# Patient Record
Sex: Male | Born: 1980 | Race: White | Hispanic: No | Marital: Married | Smoking: Never smoker
Health system: Southern US, Community
[De-identification: ages and names within clinical notes are randomized; demographics above are authoritative.]

## PROBLEM LIST (undated history)

## (undated) DIAGNOSIS — J069 Acute upper respiratory infection, unspecified: Secondary | ICD-10-CM

## (undated) DIAGNOSIS — I82890 Acute embolism and thrombosis of other specified veins: Secondary | ICD-10-CM

## (undated) DIAGNOSIS — M7711 Lateral epicondylitis, right elbow: Secondary | ICD-10-CM

## (undated) DIAGNOSIS — E669 Obesity, unspecified: Secondary | ICD-10-CM

## (undated) DIAGNOSIS — G4730 Sleep apnea, unspecified: Secondary | ICD-10-CM

## (undated) HISTORY — DX: Obesity, unspecified: E66.9

## (undated) HISTORY — DX: Sleep apnea, unspecified: G47.30

## (undated) HISTORY — PX: OTHER SURGICAL HISTORY: SHX169

## (undated) HISTORY — DX: Acute embolism and thrombosis of other specified veins: I82.890

## (undated) HISTORY — DX: Acute upper respiratory infection, unspecified: J06.9

## (undated) HISTORY — DX: Lateral epicondylitis, right elbow: M77.11

---

## 2016-01-13 DIAGNOSIS — E669 Obesity, unspecified: Secondary | ICD-10-CM | POA: Insufficient documentation

## 2016-01-13 DIAGNOSIS — M7711 Lateral epicondylitis, right elbow: Secondary | ICD-10-CM | POA: Insufficient documentation

## 2016-01-14 ENCOUNTER — Ambulatory Visit (INDEPENDENT_AMBULATORY_CARE_PROVIDER_SITE_OTHER): Payer: Managed Care, Other (non HMO) | Admitting: Family Medicine

## 2016-01-14 ENCOUNTER — Encounter: Payer: Self-pay | Admitting: Family Medicine

## 2016-01-14 VITALS — BP 118/76 | HR 59 | Temp 97.8°F | Resp 16 | Ht 71.0 in | Wt 259.0 lb

## 2016-01-14 DIAGNOSIS — I6529 Occlusion and stenosis of unspecified carotid artery: Secondary | ICD-10-CM | POA: Insufficient documentation

## 2016-01-14 DIAGNOSIS — Z8249 Family history of ischemic heart disease and other diseases of the circulatory system: Secondary | ICD-10-CM | POA: Diagnosis not present

## 2016-01-14 DIAGNOSIS — G4719 Other hypersomnia: Secondary | ICD-10-CM | POA: Diagnosis not present

## 2016-01-14 DIAGNOSIS — E669 Obesity, unspecified: Secondary | ICD-10-CM

## 2016-01-14 DIAGNOSIS — I6522 Occlusion and stenosis of left carotid artery: Secondary | ICD-10-CM | POA: Diagnosis not present

## 2016-01-14 DIAGNOSIS — R202 Paresthesia of skin: Secondary | ICD-10-CM | POA: Diagnosis not present

## 2016-01-14 DIAGNOSIS — M7711 Lateral epicondylitis, right elbow: Secondary | ICD-10-CM

## 2016-01-14 NOTE — Progress Notes (Signed)
Subjective:    Patient ID: Adam Schultz, male    DOB: 10/29/1980, 35 y.o.   MRN: [id number]  HPI: Adam Schultz is a 35 y.o. male presenting on 01/14/2016 for spot on neck   HPI  Pt presents because XR at dental office found possible carotid artery lesion. He was told to follow-up with PCP. Dad has history of TIA, HTN, HLD. Dad is also prediabetic. No personal history of of TIA. No numbness of tingling on one side of body. Has occasional hand paresthesias from elbow down while sleeping. No CP, No SOB.  Pt also reports snoring- very loud heard in another room. Wife reports he stops breathing in his sleep. Excessive day time sleepiness. Feels like even if he got 10 hours of sleep- he is not rested. Neck circumference is 17in. STOP BANG Score is: 6/8.  Risk factors include obesity and large neck circumference. Epworth sleepiness scale is 12.  Hand numbness- occurs during sleep. Only on R side. Sleeps with arms above head, under pillow.   Past Medical History  Diagnosis Date  . Right tennis elbow   . Obesity   . URI, acute     Current Outpatient Prescriptions on File Prior to Visit  Medication Sig  . dextromethorphan-guaiFENesin (MUCINEX DM) 30-600 MG 12hr tablet Take 1 tablet by mouth 2 (two) times daily as needed for cough.   No current facility-administered medications on file prior to visit.    Review of Systems  Constitutional: Positive for fatigue. Negative for fever and chills.  HENT: Negative.   Respiratory: Positive for apnea (wife reports he stops breathing during his sleep. ). Negative for chest tightness, shortness of breath and wheezing.        Snoring   Cardiovascular: Negative for chest pain, palpitations and leg swelling.  Gastrointestinal: Negative for nausea, vomiting and abdominal pain.  Endocrine: Negative.   Genitourinary: Negative for dysuria, urgency, discharge, penile pain and testicular pain.  Musculoskeletal: Negative for back pain, joint swelling and  arthralgias.  Skin: Negative.   Neurological: Positive for numbness (hand). Negative for dizziness, weakness and headaches.  Psychiatric/Behavioral: Negative for sleep disturbance and dysphoric mood.   Per HPI unless specifically indicated above     Objective:    BP 118/76 mmHg  Pulse 59  Temp(Src) 97.8 F (36.6 C) (Oral)  Resp 16  Ht 5\' 11"  (1.803 m)  Wt 259 lb (117.482 kg)  BMI 36.14 kg/m2  Wt Readings from Last 3 Encounters:  01/14/16 259 lb (117.482 kg)  01/13/16 260 lb (117.935 kg)    Physical Exam  Constitutional: He is oriented to person, place, and time. He appears well-developed and well-nourished. No distress.  HENT:  Head: Normocephalic and atraumatic.  Mouth/Throat: Uvula is midline, oropharynx is clear and moist and mucous membranes are normal.  Neck: Neck supple. No thyromegaly present.  Cardiovascular: Normal rate, regular rhythm and normal heart sounds.  PMI is not displaced.  Exam reveals no gallop and no friction rub.   No murmur heard. Pulses:      Carotid pulses are 2+ on the right side, and 2+ on the left side.      Radial pulses are 2+ on the right side, and 2+ on the left side.  No carotid bruit  Pulmonary/Chest: Effort normal and breath sounds normal. He has no wheezes.  Abdominal: Soft. Bowel sounds are normal. He exhibits no distension. There is no tenderness. There is no rebound.  Musculoskeletal: Normal range of motion. He exhibits no edema  or tenderness.  Neurological: He is alert and oriented to person, place, and time. He has normal strength and normal reflexes. A sensory deficit (diminished sensation to monofilament on palmar surface of R hand) is present. No cranial nerve deficit. He displays a negative Romberg sign.  Skin: Skin is warm and dry. No rash noted. No erythema.  Psychiatric: He has a normal mood and affect. His behavior is normal. Thought content normal.   No results found for this or any previous visit.    Assessment & Plan:    Problem List Items Addressed This Visit      Cardiovascular and Mediastinum   Carotid artery calcification - Primary    Found on dental XR. No bruit. Check CMET and lipids. [place] to determine if lesion is present. Consider referral to vascular surgery PRN.  F/u pending [place].       Relevant Orders   US Carotid Duplex Bilateral   Lipid Profile     Musculoskeletal and Integument   Right tennis elbow     Other   Obesity    Encouraged weight loss to prevent chronic disease.       Excessive daytime sleepiness     Likely 2/2 sleep apnea. STOP BANG 6/8; Epiworth sleepiness 12; Refer to sleep medicine to set up a sleep study to r/o sleep apnea.       Relevant Orders   Comprehensive Metabolic Panel (CMET)   Ambulatory referral to Sleep Studies    Other Visit Diagnoses    Family history of heart disease        Right hand paresthesia        Check B12.     Relevant Orders    CBC with Differential    B12 and Folate Panel       Meds ordered this encounter  Medications  . loratadine (CLARITIN) 10 MG tablet    Sig: Take 10 mg by mouth daily.      Follow up plan: Return if symptoms worsen or fail to improve, for pending labwork.Marland Kitchen

## 2016-01-14 NOTE — Assessment & Plan Note (Signed)
Encouraged weight loss to prevent chronic disease.

## 2016-01-14 NOTE — Assessment & Plan Note (Signed)
Likely 2/2 sleep apnea. STOP BANG 6/8; Epiworth sleepiness 12; Refer to sleep medicine to set up a sleep study to r/o sleep apnea.

## 2016-01-14 NOTE — Patient Instructions (Signed)
Please get your labwork done so we can stratify your risk factors. We will set up an ultrasound of your neck to check out what your dentist found.    We will also set you up for a sleep study.

## 2016-01-14 NOTE — Assessment & Plan Note (Signed)
Found on dental XR. No bruit. Check CMET and lipids. US to determine if lesion is present. Consider referral to vascular surgery PRN.  F/u pending US.

## 2016-01-19 ENCOUNTER — Telehealth: Payer: Self-pay | Admitting: Family Medicine

## 2016-01-19 LAB — COMPREHENSIVE METABOLIC PANEL
ALK PHOS: 50 IU/L (ref 39–117)
ALT: 28 IU/L (ref 0–44)
AST: 30 IU/L (ref 0–40)
Albumin/Globulin Ratio: 1.7 (ref 1.2–2.2)
Albumin: 4.7 g/dL (ref 3.5–5.5)
BUN/Creatinine Ratio: 12 (ref 9–20)
BUN: 14 mg/dL (ref 6–20)
Bilirubin Total: 0.6 mg/dL (ref 0.0–1.2)
CALCIUM: 9.5 mg/dL (ref 8.7–10.2)
CO2: 20 mmol/L (ref 18–29)
CREATININE: 1.18 mg/dL (ref 0.76–1.27)
Chloride: 99 mmol/L (ref 96–106)
GFR calc Af Amer: 91 mL/min/{1.73_m2} (ref >59)
GFR, EST NON AFRICAN AMERICAN: 79 mL/min/{1.73_m2} (ref >59)
GLOBULIN, TOTAL: 2.8 g/dL (ref 1.5–4.5)
GLUCOSE: 92 mg/dL (ref 65–99)
Potassium: 4 mmol/L (ref 3.5–5.2)
SODIUM: 138 mmol/L (ref 134–144)
Total Protein: 7.5 g/dL (ref 6.0–8.5)

## 2016-01-19 LAB — CBC WITH DIFFERENTIAL/PLATELET
BASOS: 0 %
Basophils Absolute: 0 10*3/uL (ref 0.0–0.2)
EOS (ABSOLUTE): 0.3 10*3/uL (ref 0.0–0.4)
EOS: 3 %
Hematocrit: 43.4 % (ref 37.5–51.0)
Hemoglobin: 15.3 g/dL (ref 12.6–17.7)
IMMATURE GRANS (ABS): 0 10*3/uL (ref 0.0–0.1)
IMMATURE GRANULOCYTES: 0 %
LYMPHS: 34 %
Lymphocytes Absolute: 3.2 10*3/uL — ABNORMAL HIGH (ref 0.7–3.1)
MCH: 30.3 pg (ref 26.6–33.0)
MCHC: 35.3 g/dL (ref 31.5–35.7)
MCV: 86 fL (ref 79–97)
MONOCYTES: 7 %
MONOS ABS: 0.7 10*3/uL (ref 0.1–0.9)
NEUTROS PCT: 56 %
Neutrophils Absolute: 5.2 10*3/uL (ref 1.4–7.0)
Platelets: 186 10*3/uL (ref 150–379)
RBC: 5.05 x10E6/uL (ref 4.14–5.80)
RDW: 14.3 % (ref 12.3–15.4)
WBC: 9.4 10*3/uL (ref 3.4–10.8)

## 2016-01-19 LAB — B12 AND FOLATE PANEL
Folate: 13.1 ng/mL (ref >3.0)
Vitamin B-12: 539 pg/mL (ref 211–946)

## 2016-01-19 LAB — LIPID PANEL
CHOL/HDL RATIO: 5.7 ratio — AB (ref 0.0–5.0)
Cholesterol, Total: 226 mg/dL — ABNORMAL HIGH (ref 100–199)
HDL: 40 mg/dL (ref >39)
LDL CALC: 162 mg/dL — AB (ref 0–99)
TRIGLYCERIDES: 120 mg/dL (ref 0–149)
VLDL Cholesterol Cal: 24 mg/dL (ref 5–40)

## 2016-01-19 NOTE — Telephone Encounter (Signed)
As pt was notified for lab work advised him that once his appointment is schedule we will call and let  him know.

## 2016-01-19 NOTE — Telephone Encounter (Signed)
Pt called to check the status of US and sleep study.  His call back number is [phone]

## 2016-01-25 ENCOUNTER — Ambulatory Visit
Admission: RE | Admit: 2016-01-25 | Discharge: 2016-01-25 | Disposition: A | Discharge status: Home / Self Care | Payer: Managed Care, Other (non HMO) | Source: Ambulatory Visit | Attending: Family Medicine | Admitting: Family Medicine

## 2016-01-25 DIAGNOSIS — I6522 Occlusion and stenosis of left carotid artery: Secondary | ICD-10-CM | POA: Diagnosis not present

## 2016-01-26 ENCOUNTER — Telehealth: Payer: Self-pay | Admitting: Family Medicine

## 2016-01-26 DIAGNOSIS — I6522 Occlusion and stenosis of left carotid artery: Secondary | ICD-10-CM

## 2016-01-26 MED ORDER — ATORVASTATIN CALCIUM 20 MG PO TABS: 20.0000 mg | ORAL_TABLET | Freq: Every day | ORAL | Status: DC

## 2016-01-26 NOTE — Telephone Encounter (Signed)
Called to review carotid ultrasound results. He does have blockage <50%. Given cholesterol we will start a statin to prevent further blockages. Also consider baby aspirin. Discussed if patient would like to see vein and vascular for discussion of risks and surgical treatment if ever needed. Will discuss in depth at 3 mos follow-up.

## 2016-03-17 ENCOUNTER — Telehealth: Payer: Self-pay | Admitting: Family Medicine

## 2016-03-17 DIAGNOSIS — G4719 Other hypersomnia: Secondary | ICD-10-CM

## 2016-03-17 DIAGNOSIS — G4733 Obstructive sleep apnea (adult) (pediatric): Secondary | ICD-10-CM

## 2016-03-17 NOTE — Telephone Encounter (Signed)
Referral has been entered for pulmonary consult. Also called [facility] and left a message  Need to find out if they offer in home sleep study.

## 2016-03-17 NOTE — Telephone Encounter (Signed)
Pt was going to do sleep study at the hospital but his insurance denied it.  Please call [phone]

## 2016-04-10 ENCOUNTER — Institutional Professional Consult (permissible substitution): Payer: Managed Care, Other (non HMO) | Admitting: Internal Medicine

## 2016-04-14 ENCOUNTER — Encounter: Payer: Self-pay | Admitting: Internal Medicine

## 2016-04-14 ENCOUNTER — Ambulatory Visit (INDEPENDENT_AMBULATORY_CARE_PROVIDER_SITE_OTHER): Payer: Managed Care, Other (non HMO) | Admitting: Internal Medicine

## 2016-04-14 VITALS — BP 126/68 | HR 94 | Ht 72.0 in | Wt 248.0 lb

## 2016-04-14 DIAGNOSIS — G4719 Other hypersomnia: Secondary | ICD-10-CM

## 2016-04-14 NOTE — Patient Instructions (Signed)
Obtain Sleep study ASAP  Sleep Apnea  Sleep apnea is a sleep disorder characterized by abnormal pauses in breathing while you sleep. When your breathing pauses, the level of oxygen in your blood decreases. This causes you to move out of deep sleep and into light sleep. As a result, your quality of sleep is poor, and the system that carries your blood throughout your body (cardiovascular system) experiences stress. If sleep apnea remains untreated, the following conditions can develop:  High blood pressure (hypertension).  Coronary artery disease.  Inability to achieve or maintain an erection (impotence).  Impairment of your thought process (cognitive dysfunction). There are three types of sleep apnea: 1. Obstructive sleep apnea--Pauses in breathing during sleep because of a blocked airway. 2. Central sleep apnea--Pauses in breathing during sleep because the area of the brain that controls your breathing does not send the correct signals to the muscles that control breathing. 3. Mixed sleep apnea--A combination of both obstructive and central sleep apnea. RISK FACTORS The following risk factors can increase your risk of developing sleep apnea:  Being overweight.  Smoking.  Having narrow passages in your nose and throat.  Being of older age.  Being male.  Alcohol use.  Sedative and tranquilizer use.  Ethnicity. Among individuals younger than 35 years, African Americans are at increased risk of sleep apnea. SYMPTOMS   Difficulty staying asleep.  Daytime sleepiness and fatigue.  Loss of energy.  Irritability.  Loud, heavy snoring.  Morning headaches.  Trouble concentrating.  Forgetfulness.  Decreased interest in sex.  Unexplained sleepiness. DIAGNOSIS  In order to diagnose sleep apnea, your caregiver will perform a physical examination. A sleep study done in the comfort of your own home may be appropriate if you are otherwise healthy. Your caregiver may also  recommend that you spend the night in a sleep lab. In the sleep lab, several monitors record information about your heart, lungs, and brain while you sleep. Your leg and arm movements and blood oxygen level are also recorded. TREATMENT The following actions may help to resolve mild sleep apnea:  Sleeping on your side.   Using a decongestant if you have nasal congestion.   Avoiding the use of depressants, including alcohol, sedatives, and narcotics.   Losing weight and modifying your diet if you are overweight. There also are devices and treatments to help open your airway:  Oral appliances. These are custom-made mouthpieces that shift your lower jaw forward and slightly open your bite. This opens your airway.  Devices that create positive airway pressure. This positive pressure "splints" your airway open to help you breathe better during sleep. The following devices create positive airway pressure:  Continuous positive airway pressure (CPAP) device. The CPAP device creates a continuous level of air pressure with an air pump. The air is delivered to your airway through a mask while you sleep. This continuous pressure keeps your airway open.  Nasal expiratory positive airway pressure (EPAP) device. The EPAP device creates positive air pressure as you exhale. The device consists of single-use valves, which are inserted into each nostril and held in place by adhesive. The valves create very little resistance when you inhale but create much more resistance when you exhale. That increased resistance creates the positive airway pressure. This positive pressure while you exhale keeps your airway open, making it easier to breath when you inhale again.  Bilevel positive airway pressure (BPAP) device. The BPAP device is used mainly in patients with central sleep apnea. This device is similar  to the CPAP device because it also uses an air pump to deliver continuous air pressure through a mask. However,  with the BPAP machine, the pressure is set at two different levels. The pressure when you exhale is lower than the pressure when you inhale.  Surgery. Typically, surgery is only done if you cannot comply with less invasive treatments or if the less invasive treatments do not improve your condition. Surgery involves removing excess tissue in your airway to create a wider passage way.   This information is not intended to replace advice given to you by your health care provider. Make sure you discuss any questions you have with your health care provider.   Document Released: 08/25/2002 Document Revised: 09/25/2014 Document Reviewed: 01/11/2012 Elsevier Interactive Patient Education [employer].

## 2016-04-14 NOTE — Progress Notes (Signed)
[facility] Pulmonary Medicine Consultation      Date: 04/14/2016,   MRN# [id number] Koi Zangara 08-20-1981 Code Status:  Code Status History    This patient does not have a recorded code status. Please follow your organizational policy for patients in this situation.     Hosp day:@LENGTHOFSTAYDAYS @ Referring MD: @     PCP:      AdmissionWeight: 248 lb (112.5 kg)                 CurrentWeight: 248 lb (112.5 kg) Zong Mcquarrie is a 35 y.o. 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 Nonsmoker, occasional ETOH use Very active, has 2 kids Epworth sleep score 12  No signs of infection at this time, no lower ext swelling noted     PAST MEDICAL HISTORY   Past Medical History:  Diagnosis Date  . Obesity   . Right tennis elbow   . URI, acute      SURGICAL HISTORY   Past Surgical History:  Procedure Laterality Date  . none       FAMILY HISTORY   Family History  Problem Relation Age of Onset  . Hyperlipidemia Father   . Hypertension Father   . Transient ischemic attack Father   . Stroke Father      SOCIAL HISTORY   Social History  Substance Use Topics  . Smoking status: Never Smoker  . Smokeless tobacco: Never Used  . Alcohol use 3.6 oz/week    6 Cans of beer per week     MEDICATIONS    Home Medication:  Current Outpatient Rx  . Order #: [id number] Class: Historical Med  . Order #: [id number] Class: Historical Med    Current Medication:  Current Outpatient Prescriptions:  .   dextromethorphan-guaiFENesin (MUCINEX DM) 30-600 MG 12hr tablet, Take 1 tablet by mouth 2 (two) times daily as needed for cough., Disp: , Rfl:  .  loratadine (CLARITIN) 10 MG tablet, Take 10 mg by mouth daily., Disp: , Rfl:     ALLERGIES   Review of patient's allergies indicates no known allergies.     REVIEW OF SYSTEMS   Review of Systems  Constitutional: Negative for chills, diaphoresis, fever and weight loss.  HENT: Negative for congestion and hearing loss.   Eyes: Negative for blurred vision and double vision.  Respiratory: Negative for cough, hemoptysis, sputum production, shortness of breath and wheezing.   Cardiovascular: Negative for chest pain, palpitations and orthopnea.  Gastrointestinal: Negative for abdominal pain, heartburn, nausea and vomiting.  Genitourinary: Negative for dysuria and urgency.  Musculoskeletal: Negative for back pain, myalgias and neck pain.  Skin: Negative for rash.  Neurological: Negative for dizziness, weakness and headaches.  Endo/Heme/Allergies: Does not bruise/bleed easily.  Psychiatric/Behavioral: Negative for depression.  All other systems reviewed and are negative.    VS: BP 126/68 (BP Location: Left Arm, Cuff Size: Normal)   Pulse 94   Ht 6' (1.829 m)   Wt 248 lb (112.5  kg)   SpO2 100%   BMI 33.63 kg/m      PHYSICAL EXAM  Physical Exam  Constitutional: He is oriented to person, place, and time. He appears well-developed and well-nourished. No distress.  HENT:  Head: Normocephalic and atraumatic.  Mouth/Throat: No oropharyngeal exudate.  Eyes: EOM are normal. Pupils are equal, round, and reactive to light. No scleral icterus.  Neck: Normal range of motion. Neck supple.  Cardiovascular: Normal rate, regular rhythm and normal heart sounds.   No murmur heard. Pulmonary/Chest: No stridor. No respiratory distress. He has no wheezes.  Abdominal: Soft. Bowel sounds are normal.  Musculoskeletal: Normal range of motion. He exhibits  no edema.  Neurological: He is alert and oriented to person, place, and time. No cranial nerve deficit.  Skin: Skin is warm. He is not diaphoretic.  Psychiatric: He has a normal mood and affect.       ASSESSMENT/PLAN   35 yo white male with signs and symptoms of sleep apnea with excessive fatigue and daytime sleepiness associated with loud snoring and breathing problems at night with witnessed apnea spells  Will need sleep study ASAP Recommend Weight Loss  The Patient requires high complexity decision making for assessment and support, frequent evaluation and titration of therapies, application of advanced monitoring technologies and extensive interpretation of multiple databases.   Patient satisfied with Plan of action and management. All questions answered  Lucie Leather, M.D.  [facility]  Medical Director [facility] Medical Director [facility] Cardio-Pulmonary Department

## 2016-05-01 ENCOUNTER — Telehealth: Payer: Self-pay | Admitting: Internal Medicine

## 2016-05-01 DIAGNOSIS — G4719 Other hypersomnia: Secondary | ICD-10-CM

## 2016-05-01 NOTE — Telephone Encounter (Signed)
Order was placed for in lab Split Night Study. Pt's insurance, [employer] [place], prefers HST instead.  If this is agreeable with you can we have an order for a HST and cancel the in lab split?  Please advise.  Thanks, Ollen Grosshonda J Cobb

## 2016-05-02 NOTE — Telephone Encounter (Signed)
[facility]ed [facility] lab study to HST due to insurance preference . Please cancel in lab study order and place order for HST if Dr. Belia HemanKasa approves.  Thank you. Rhonda J Cobb

## 2016-05-02 NOTE — Telephone Encounter (Signed)
Ordered changed to HST per pt's insurance. Nothing further needed.

## 2016-05-17 IMAGING — US US CAROTID DUPLEX BILAT
1 series · 13 of 24 positions shown · non-contrast
Comparison: None.

CLINICAL DATA: Carotid calcification noted on dental radiograph

EXAM:
BILATERAL CAROTID DUPLEX ULTRASOUND
TECHNIQUE: Gray scale imaging, color Doppler and duplex ultrasound was
performed of bilateral carotid and vertebral arteries in the neck.

[Series 1: us carotid duplex bilat · 0.06mm/px · 13 of 68 slices shown]
[im 1/68]
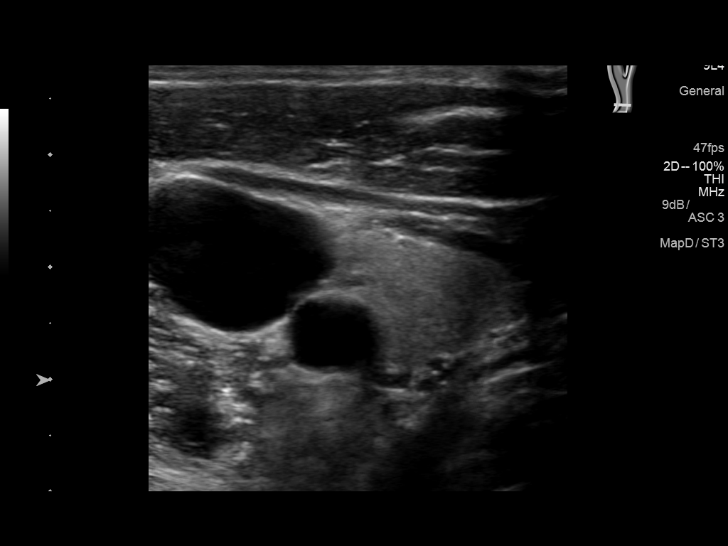
[im 6/68]
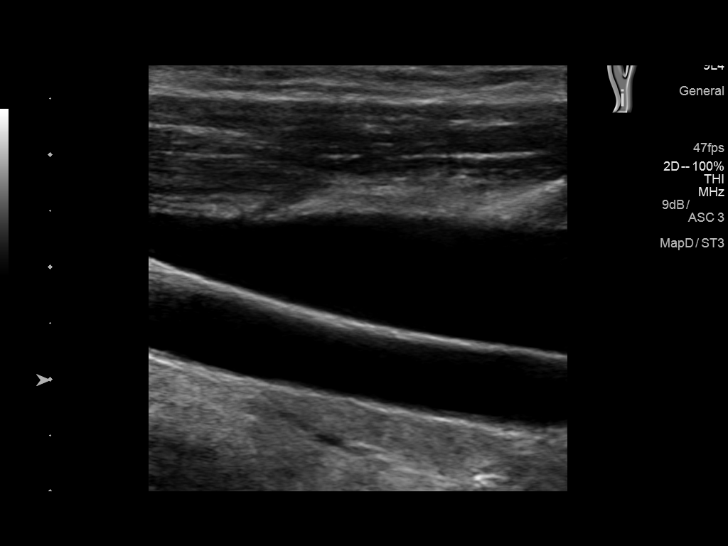
[im 12/68]
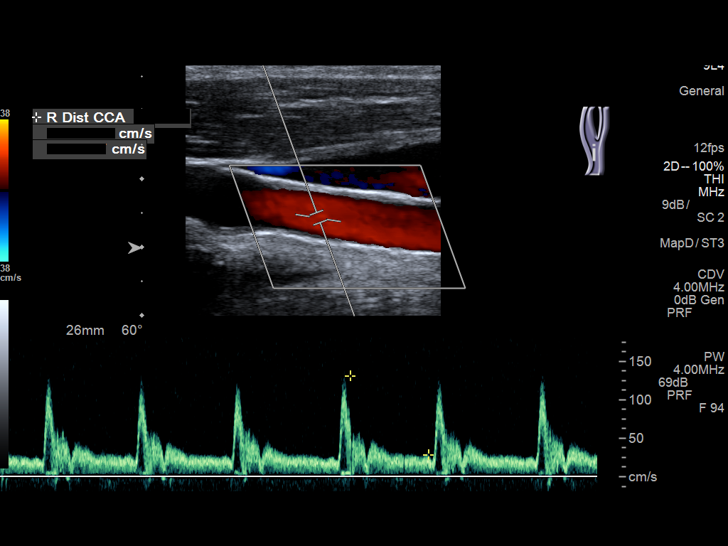
[im 18/68]
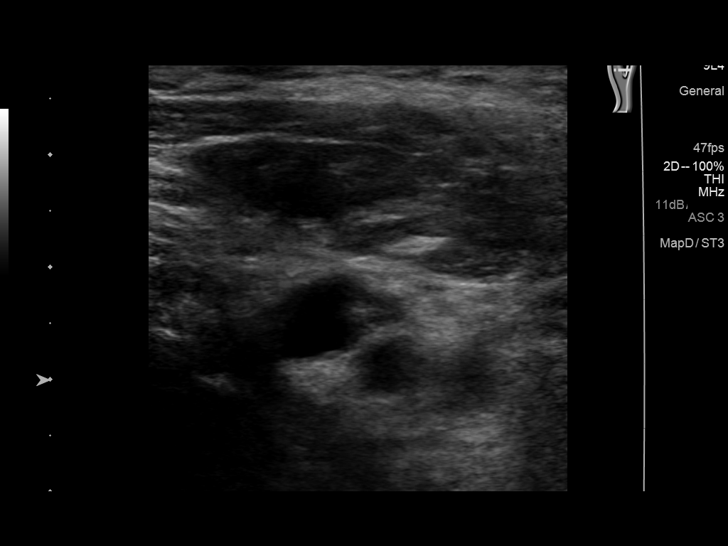
[im 24/68]
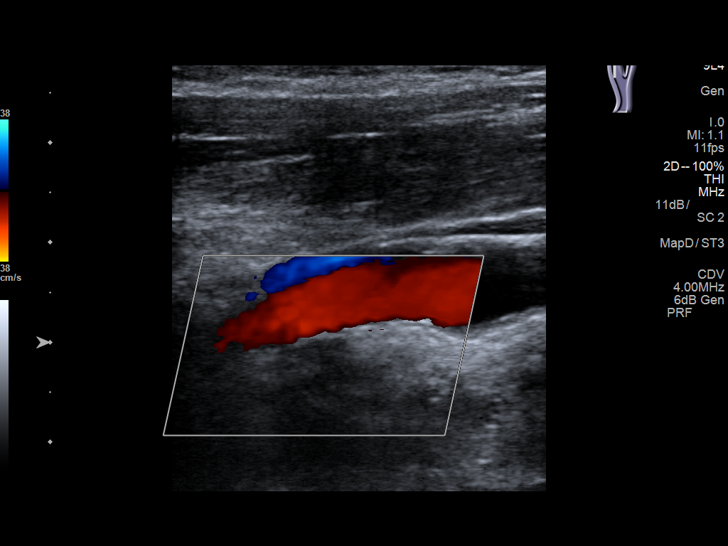
[im 30/68]
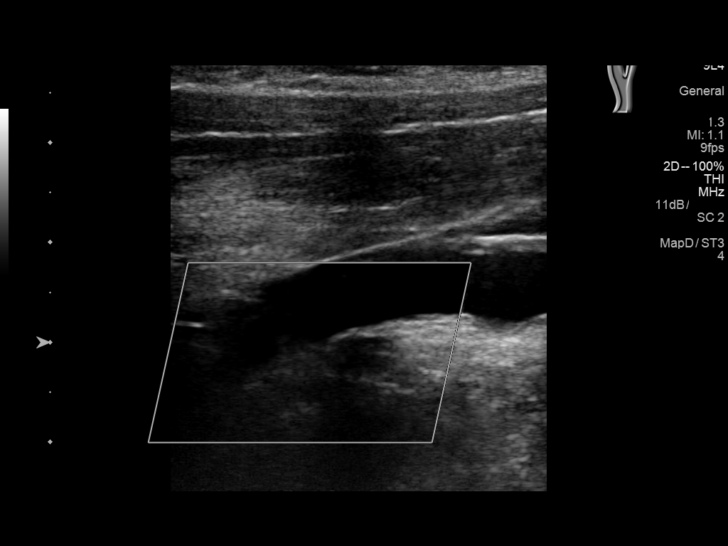
[im 35/68]
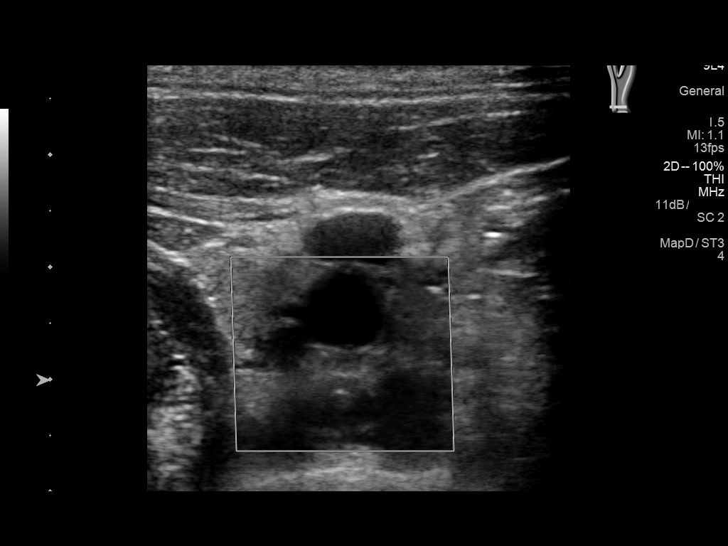
[im 38/68]
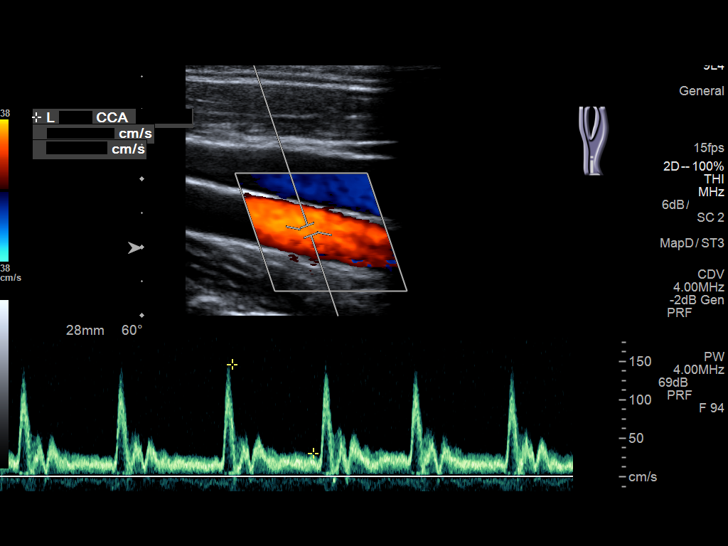
[im 44/68]
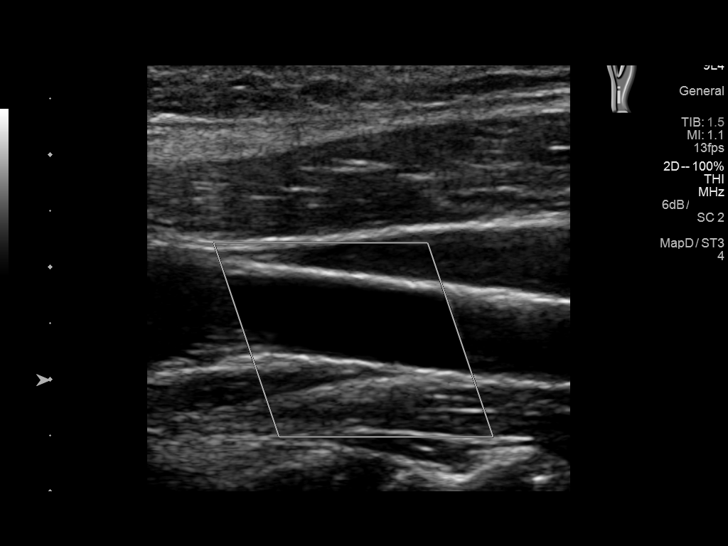
[im 50/68]
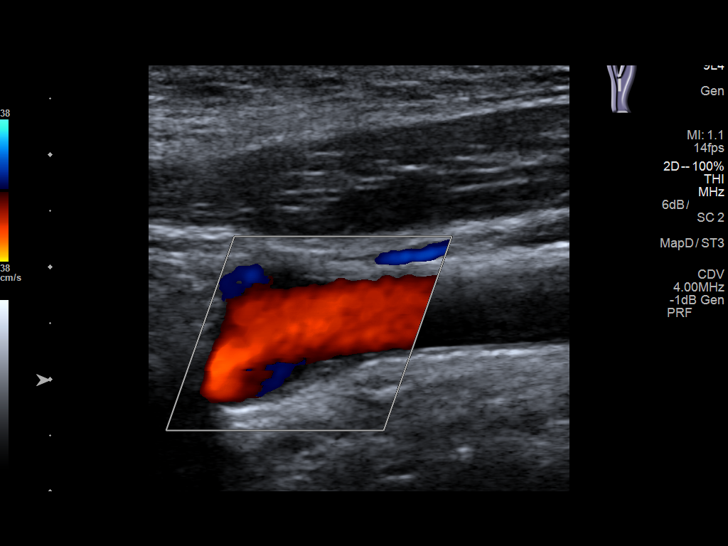
[im 56/68]
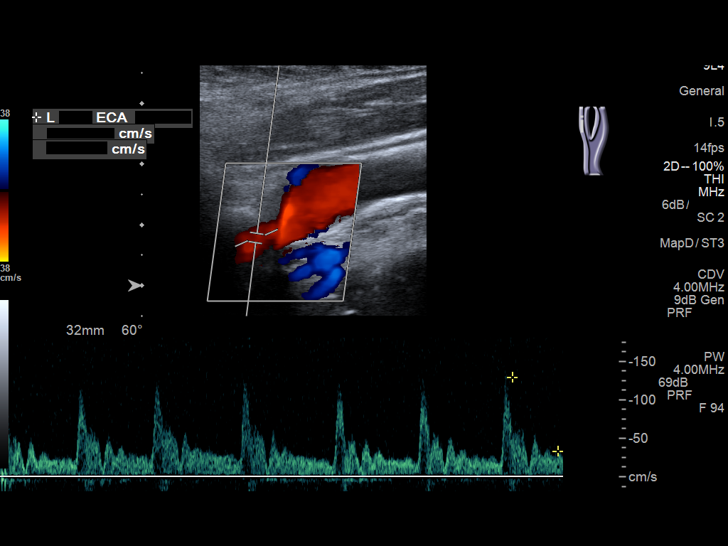
[im 62/68]
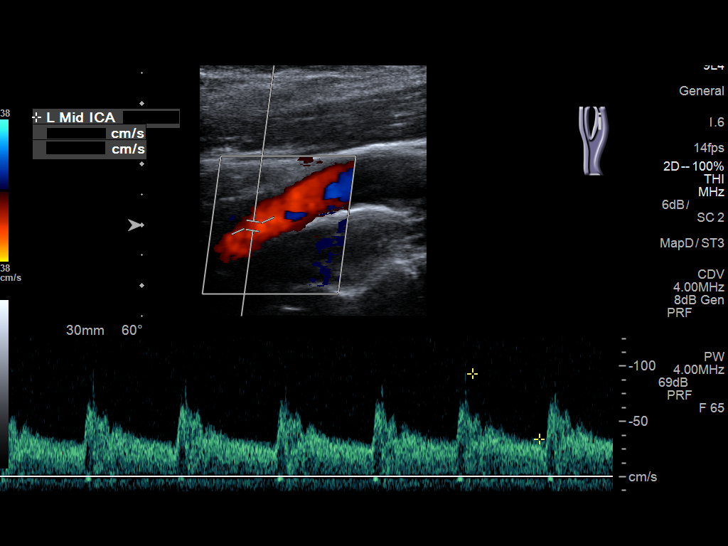
[im 68/68]
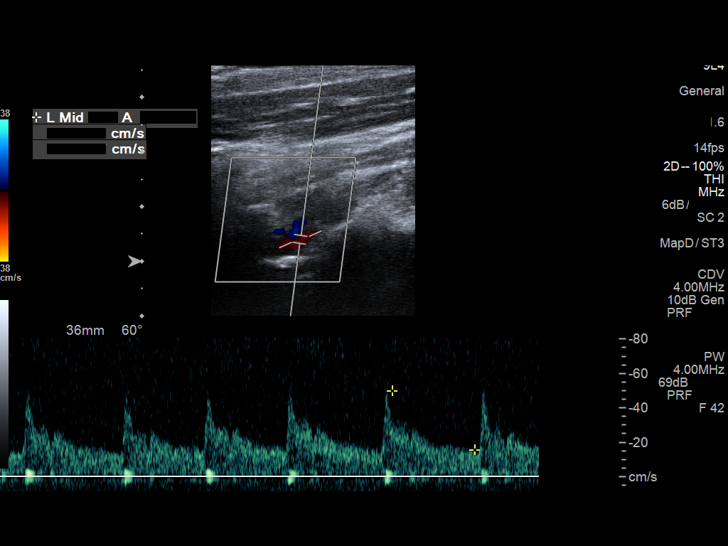

[13 of 24 positions shown; findings below may reference images not displayed]

REVIEW OF SYSTEMS:
Quantification of carotid stenosis is based on velocity parameters
that correlate the residual internal carotid diameter with
NASCET-based stenosis levels, using the diameter of the distal
internal carotid lumen as the denominator for stenosis measurement.

The following velocity measurements were obtained:

PEAK SYSTOLIC/END DIASTOLIC

RIGHT

ICA:                     103/28cm/sec

CCA:                     169/31cm/sec

SYSTOLIC ICA/CCA RATIO:

DIASTOLIC ICA/CCA RATIO:

ECA:                     109cm/sec

LEFT

ICA:                     93/34cm/sec

CCA:                     131/28cm/sec

SYSTOLIC ICA/CCA RATIO:

DIASTOLIC ICA/CCA RATIO:

ECA:                     130cm/sec
FINDINGS: RIGHT CAROTID ARTERY: No significant plaque or stenosis. Normal
waveforms and color Doppler signal.

RIGHT VERTEBRAL ARTERY:  Normal flow direction and waveform.

LEFT CAROTID ARTERY: Mild eccentric plaque in the carotid bulb. No
high-grade stenosis. Normal waveforms and color Doppler signal.

LEFT VERTEBRAL ARTERY: Normal flow direction and waveform.
IMPRESSION: 1. Mild left carotid bulb plaque resulting in less than 50% diameter
stenosis. The exam does not exclude plaque ulceration or
embolization. Continued surveillance recommended.

## 2016-06-15 DIAGNOSIS — G4733 Obstructive sleep apnea (adult) (pediatric): Secondary | ICD-10-CM | POA: Diagnosis not present

## 2016-06-20 DIAGNOSIS — G4733 Obstructive sleep apnea (adult) (pediatric): Secondary | ICD-10-CM

## 2016-06-21 ENCOUNTER — Other Ambulatory Visit: Payer: Self-pay | Admitting: *Deleted

## 2016-06-21 DIAGNOSIS — G4719 Other hypersomnia: Secondary | ICD-10-CM

## 2016-06-27 ENCOUNTER — Telehealth: Payer: Self-pay | Admitting: *Deleted

## 2016-06-27 DIAGNOSIS — G4733 Obstructive sleep apnea (adult) (pediatric): Secondary | ICD-10-CM

## 2016-06-27 NOTE — Telephone Encounter (Signed)
Tried to call pt to inform him of sleep study results which show mild OSA and to let him know that we need to get him setup on a CPAP with auto setting. No answer and VM was full and unable to LM.

## 2016-06-28 NOTE — Telephone Encounter (Signed)
Pt aware of sleep study results. Order placed. Pt voiced understanding and had no further questions. Nothing further needed.

## 2017-03-09 ENCOUNTER — Ambulatory Visit: Payer: Managed Care, Other (non HMO) | Admitting: Nurse Practitioner

## 2020-05-12 ENCOUNTER — Other Ambulatory Visit: Payer: Self-pay

## 2020-05-12 ENCOUNTER — Ambulatory Visit (INDEPENDENT_AMBULATORY_CARE_PROVIDER_SITE_OTHER): Payer: Managed Care, Other (non HMO) | Admitting: Family Medicine

## 2020-05-12 ENCOUNTER — Encounter: Payer: Self-pay | Admitting: Family Medicine

## 2020-05-12 VITALS — BP 133/86 | HR 59 | Temp 98.3°F | Resp 18 | Ht 71.5 in | Wt 248.4 lb

## 2020-05-12 DIAGNOSIS — Z79899 Other long term (current) drug therapy: Secondary | ICD-10-CM | POA: Diagnosis not present

## 2020-05-12 DIAGNOSIS — Z0000 Encounter for general adult medical examination without abnormal findings: Secondary | ICD-10-CM | POA: Diagnosis not present

## 2020-05-12 DIAGNOSIS — G4719 Other hypersomnia: Secondary | ICD-10-CM

## 2020-05-12 DIAGNOSIS — Z7689 Persons encountering health services in other specified circumstances: Secondary | ICD-10-CM | POA: Insufficient documentation

## 2020-05-12 DIAGNOSIS — R635 Abnormal weight gain: Secondary | ICD-10-CM | POA: Diagnosis not present

## 2020-05-12 NOTE — Assessment & Plan Note (Signed)
Presents to re-establish for primary care with [facility]  Denies any changes in medical history since last being seen in clinic in 2017.  Plan: 1. Baseline labs ordered 2. RTC in 2-3 months for CPE

## 2020-05-12 NOTE — Patient Instructions (Signed)
Have your labs drawn and we will contact you with the results.  We will plan to see you back in 2-3 months for your physical  You will receive a survey after today's visit either digitally by e-mail or paper by USPS mail. Your experiences and feedback matter to [place].  Please respond so we know how we are doing as we provide care for you.  Call us with any questions/concerns/needs.  It is my goal to be available to you for your health concerns.  Thanks for choosing me to be a partner in your healthcare needs!  Charlaine Dalton, FNP-C Family Nurse Practitioner [facility] Phone: [phone]

## 2020-05-12 NOTE — Progress Notes (Signed)
Subjective:    Patient ID: Adam Schultz, male    DOB: 1981-01-19, 39 y.o.   MRN: [id number]  Adam Schultz is a 39 y.o. male presenting on 05/12/2020 for Establish Care   HPI  Adam Schultz presents to clinic today to re-establish care.  Was previously seen in our office by another nurse practitioner last in 2017.  Past medical, family, and surgical history reviewed w/ pt.   Depression screen PHQ 2/9 05/12/2020  Decreased Interest 0  Down, Depressed, Hopeless 0  PHQ - 2 Score 0    Social History   Tobacco Use  . Smoking status: Never Smoker  . Smokeless tobacco: Never Used  Vaping Use  . Vaping Use: Never used  Substance Use Topics  . Alcohol use: Not Currently    Alcohol/week: 3.0 standard drinks    Types: 3 Cans of beer per week  . Drug use: No    Review of Systems  Constitutional: Negative.   HENT: Negative.   Eyes: Negative.   Respiratory: Negative.   Cardiovascular: Negative.   Gastrointestinal: Negative.   Endocrine: Negative.   Genitourinary: Negative.   Musculoskeletal: Negative.   Skin: Negative.   Allergic/Immunologic: Negative.   Neurological: Negative.   Hematological: Negative.   Psychiatric/Behavioral: Negative.    Per HPI unless specifically indicated above     Objective:    BP 133/86 (BP Location: Left Arm, Patient Position: Sitting, Cuff Size: Large)   Pulse (!) 59   Temp 98.3 F (36.8 C) (Oral)   Resp 18   Ht 5' 11.5" (1.816 m)   Wt 248 lb 6.4 oz (112.7 kg)   SpO2 100%   BMI 34.16 kg/m   Wt Readings from Last 3 Encounters:  05/12/20 248 lb 6.4 oz (112.7 kg)  04/14/16 248 lb (112.5 kg)  01/14/16 259 lb (117.5 kg)    Physical Exam Vitals reviewed.  Constitutional:      General: He is not in acute distress.    Appearance: Normal appearance. He is well-developed and well-groomed. He is obese. He is not ill-appearing or toxic-appearing.  HENT:     Head: Normocephalic and atraumatic.     Nose:     Comments: Adam Schultz is in place, covering  mouth and nose. Eyes:     General:        Right eye: No discharge.        Left eye: No discharge.     Extraocular Movements: Extraocular movements intact.     Conjunctiva/sclera: Conjunctivae normal.     Pupils: Pupils are equal, round, and reactive to light.  Cardiovascular:     Rate and Rhythm: Normal rate and regular rhythm.     Pulses: Normal pulses.     Heart sounds: Normal heart sounds. No murmur heard.  No friction rub. No gallop.   Pulmonary:     Effort: Pulmonary effort is normal. No respiratory distress.     Breath sounds: Normal breath sounds.  Musculoskeletal:     Right lower leg: No edema.     Left lower leg: No edema.  Skin:    General: Skin is warm and dry.     Capillary Refill: Capillary refill takes less than 2 seconds.  Neurological:     General: No focal deficit present.     Mental Status: He is alert and oriented to person, place, and time.  Psychiatric:        Attention and Perception: Attention and perception normal.  Mood and Affect: Mood and affect normal.        Speech: Speech normal.        Behavior: Behavior normal. Behavior is cooperative.        Thought Content: Thought content normal.        Cognition and Memory: Cognition and memory normal.    Results for orders placed or performed in visit on 01/14/16  Lipid Profile  Result Value Ref Range   Cholesterol, Total 226 (H) 100 - 199 mg/dL   Triglycerides 329 0 - 149 mg/dL   HDL 40 >51 mg/dL   VLDL Cholesterol Cal 24 5 - 40 mg/dL   LDL Calculated 884 (H) 0 - 99 mg/dL   Chol/HDL Ratio 5.7 (H) 0.0 - 5.0 ratio units  Comprehensive Metabolic Panel (CMET)  Result Value Ref Range   Glucose 92 65 - 99 mg/dL   BUN 14 6 - 20 mg/dL   Creatinine, Ser 1.66 0.76 - 1.27 mg/dL   GFR calc non Af Amer 79 >59 mL/min/1.73   GFR calc Af Amer 91 >59 mL/min/1.73   BUN/Creatinine Ratio 12 9 - 20   Sodium 138 134 - 144 mmol/L   Potassium 4.0 3.5 - 5.2 mmol/L   Chloride 99 96 - 106 mmol/L   CO2 20 18 - 29  mmol/L   Calcium 9.5 8.7 - 10.2 mg/dL   Total Protein 7.5 6.0 - 8.5 g/dL   Albumin 4.7 3.5 - 5.5 g/dL   Globulin, Total 2.8 1.5 - 4.5 g/dL   Albumin/Globulin Ratio 1.7 1.2 - 2.2   Bilirubin Total 0.6 0.0 - 1.2 mg/dL   Alkaline Phosphatase 50 39 - 117 IU/L   AST 30 0 - 40 IU/L   ALT 28 0 - 44 IU/L  CBC with Differential  Result Value Ref Range   WBC 9.4 3.4 - 10.8 x10E3/uL   RBC 5.05 4.14 - 5.80 x10E6/uL   Hemoglobin 15.3 12.6 - 17.7 g/dL   Hematocrit 06.3 01.6 - 51.0 %   MCV 86 79 - 97 fL   MCH 30.3 26.6 - 33.0 pg   MCHC 35.3 31 - 35 g/dL   RDW 01.0 93.2 - 35.5 %   Platelets 186 150 - 379 x10E3/uL   Neutrophils 56 %   Lymphs 34 %   Monocytes 7 %   Eos 3 %   Basos 0 %   Neutrophils Absolute 5.2 1 - 7 x10E3/uL   Lymphocytes Absolute 3.2 (H) 0 - 3 x10E3/uL   Monocytes Absolute 0.7 0 - 0 x10E3/uL   EOS (ABSOLUTE) 0.3 0.0 - 0.4 x10E3/uL   Basophils Absolute 0.0 0 - 0 x10E3/uL   Immature Granulocytes 0 %   Immature Grans (Abs) 0.0 0.0 - 0.1 x10E3/uL  B12 and Folate Panel  Result Value Ref Range   Vitamin B-12 539 211 - 946 pg/mL   Folate 13.1 >3.0 ng/mL      Assessment & Plan:   Problem List Items Addressed This Visit      Other   Excessive daytime sleepiness    Dx with OSA and had started to use a CPAP, reports no benefit with this and has stopped usage.      Encounter to establish care with new doctor - Primary    Presents to re-establish for primary care with [facility]  Denies any changes in medical history since last being seen in clinic in 2017.  Plan: 1. Baseline labs ordered 2. RTC in 2-3 months for CPE  Relevant Orders   Ambulatory referral to Dermatology    Other Visit Diagnoses    Routine medical exam       Relevant Orders   CBC with Differential   COMPLETE METABOLIC PANEL WITH GFR   Weight gain       Relevant Orders   Lipid Profile   Long-term use of high-risk medication       Relevant Orders   Thyroid Panel With TSH      No orders of the  defined types were placed in this encounter.     Follow up plan: Return in about 3 months (around 08/12/2020) for CPE.   Charlaine Dalton, FNP Family Nurse Practitioner [facility] 05/12/2020, 10:56 AM

## 2020-05-12 NOTE — Assessment & Plan Note (Signed)
Dx with OSA and had started to use a CPAP, reports no benefit with this and has stopped usage.

## 2020-05-13 LAB — CBC WITH DIFFERENTIAL/PLATELET
Absolute Monocytes: 600 cells/uL (ref 200–950)
Basophils Absolute: 32 cells/uL (ref 0–200)
Basophils Relative: 0.4 %
Eosinophils Absolute: 128 cells/uL (ref 15–500)
Eosinophils Relative: 1.6 %
HCT: 48.3 % (ref 38.5–50.0)
Hemoglobin: 16.2 g/dL (ref 13.2–17.1)
Lymphs Abs: 2288 cells/uL (ref 850–3900)
MCH: 30.4 pg (ref 27.0–33.0)
MCHC: 33.5 g/dL (ref 32.0–36.0)
MCV: 90.6 fL (ref 80.0–100.0)
MPV: 10.9 fL (ref 7.5–12.5)
Monocytes Relative: 7.5 %
Neutro Abs: 4952 cells/uL (ref 1500–7800)
Neutrophils Relative %: 61.9 %
Platelets: 162 10*3/uL (ref 140–400)
RBC: 5.33 10*6/uL (ref 4.20–5.80)
RDW: 13.5 % (ref 11.0–15.0)
Total Lymphocyte: 28.6 %
WBC: 8 10*3/uL (ref 3.8–10.8)

## 2020-05-13 LAB — COMPLETE METABOLIC PANEL WITH GFR
AG Ratio: 1.9 (calc) (ref 1.0–2.5)
ALT: 16 U/L (ref 9–46)
AST: 21 U/L (ref 10–40)
Albumin: 5.2 g/dL — ABNORMAL HIGH (ref 3.6–5.1)
Alkaline phosphatase (APISO): 47 U/L (ref 36–130)
BUN: 15 mg/dL (ref 7–25)
CO2: 26 mmol/L (ref 20–32)
Calcium: 10.2 mg/dL (ref 8.6–10.3)
Chloride: 101 mmol/L (ref 98–110)
Creat: 1.2 mg/dL (ref 0.60–1.35)
GFR, Est African American: 87 mL/min/{1.73_m2} (ref >=60)
GFR, Est Non African American: 75 mL/min/{1.73_m2} (ref >=60)
Globulin: 2.8 g/dL (calc) (ref 1.9–3.7)
Glucose, Bld: 88 mg/dL (ref 65–99)
Potassium: 4.2 mmol/L (ref 3.5–5.3)
Sodium: 139 mmol/L (ref 135–146)
Total Bilirubin: 0.8 mg/dL (ref 0.2–1.2)
Total Protein: 8 g/dL (ref 6.1–8.1)

## 2020-05-13 LAB — LIPID PANEL
Cholesterol: 251 mg/dL — ABNORMAL HIGH (ref <200)
HDL: 49 mg/dL (ref >=40)
LDL Cholesterol (Calc): 178 mg/dL (calc) — ABNORMAL HIGH
Non-HDL Cholesterol (Calc): 202 mg/dL (calc) — ABNORMAL HIGH (ref <130)
Total CHOL/HDL Ratio: 5.1 (calc) — ABNORMAL HIGH (ref <5.0)
Triglycerides: 116 mg/dL (ref <150)

## 2020-05-13 LAB — THYROID PANEL WITH TSH
Free Thyroxine Index: 2.3 (ref 1.4–3.8)
T3 Uptake: 33 % (ref 22–35)
T4, Total: 7.1 ug/dL (ref 4.9–10.5)
TSH: 2.33 mIU/L (ref 0.40–4.50)

## 2020-06-22 ENCOUNTER — Ambulatory Visit: Payer: Managed Care, Other (non HMO) | Admitting: Dermatology

## 2020-08-16 ENCOUNTER — Ambulatory Visit (INDEPENDENT_AMBULATORY_CARE_PROVIDER_SITE_OTHER): Payer: Managed Care, Other (non HMO) | Admitting: Family Medicine

## 2020-08-16 ENCOUNTER — Other Ambulatory Visit: Payer: Self-pay

## 2020-08-16 ENCOUNTER — Encounter: Payer: Self-pay | Admitting: Family Medicine

## 2020-08-16 VITALS — BP 120/62 | HR 52 | Temp 97.8°F | Resp 17 | Ht 71.5 in | Wt 224.0 lb

## 2020-08-16 DIAGNOSIS — Z2821 Immunization not carried out because of patient refusal: Secondary | ICD-10-CM | POA: Diagnosis not present

## 2020-08-16 DIAGNOSIS — Z0000 Encounter for general adult medical examination without abnormal findings: Secondary | ICD-10-CM | POA: Diagnosis not present

## 2020-08-16 DIAGNOSIS — G4733 Obstructive sleep apnea (adult) (pediatric): Secondary | ICD-10-CM | POA: Diagnosis not present

## 2020-08-16 NOTE — Assessment & Plan Note (Signed)
Annual physical exam without new findings.  Well adult with no acute concerns.  Plan: 1. Obtain health maintenance screenings as above according to age. - Increase physical activity to 30 minutes most days of the week.  - Eat healthy diet high in vegetables and fruits; low in refined carbohydrates. - Screening labs and tests as ordered 2. Return 1 year for annual physical.  

## 2020-08-16 NOTE — Patient Instructions (Signed)
Well Visit: Care Instructions Overview  Well visits can help you stay healthy. Your provider has checked your overall health and may have suggested ways to take good care of yourself. Your provider also may have recommended tests. At home, you can help prevent illness with healthy eating, regular exercise, and other steps.  Follow-up care is a key part of your treatment and safety. Be sure to make and go to all appointments, and call your provider if you are having problems. It's also a good idea to know your test results and keep a list of the medicines you take.  How can you care for yourself at home?   Get screening tests that you and your doctor decide on. Screening helps find diseases before any symptoms appear.   Eat healthy foods. Choose fruits, vegetables, whole grains, protein, and low-fat dairy foods. Limit fat, especially saturated fat. Reduce salt in your diet.   Limit alcohol. If you are a man, have no more than 2 drinks a day or 14 drinks a week. If you are a woman, have no more than 1 drink a day or 7 drinks a week.   Get at least 30 minutes of physical activity on most days of the week.  We recommend you go no more than 2 days in a row without exercise. Walking is a good choice. You also may want to do other activities, such as running, swimming, cycling, or playing tennis or team sports. Discuss any changes in your exercise program with your provider.   Reach and stay at a healthy weight. This will lower your risk for many problems, such as obesity, diabetes, heart disease, and high blood pressure.   Do not smoke or allow others to smoke around you. If you need help quitting, talk to your provider about stop-smoking programs and medicines. These can increase your chances of quitting for good.  Can call 1-800-QUIT-NOW (1-800-784-8669) for the [place] Quitline, assistance with smoking cessation.   Care for your mental health. It is easy to get weighed down by worry  and stress. Learn strategies to manage stress, like deep breathing and mindfulness, and stay connected with your family and community. If you find you often feel sad or hopeless, talk with your provider. Treatment can help.   Talk to your provider about whether you have any risk factors for sexually transmitted infections (STIs). You can help prevent STIs if you wait to have sex with a new partner (or partners) until you've each been tested for STIs. It also helps if you use condoms (male or male condoms) and if you limit your sex partners to one person who only has sex with you. Vaccines are available for some STIs, such as HPV (these are age dependent).   If you think you may have a problem with alcohol or drug use, talk to your provider. This includes prescription medicines (such as amphetamines and opioids) and illegal drugs (such as cocaine and methamphetamine). Your provider can help you figure out what type of treatment is best for you.   If you have concerns about domestic violence or intimate partner violence, there are resources available to you. National Domestic Abuse Hotline 1-800-7233   Protect your skin from too much sun. When you're outdoors from 10 a.m. to 4 p.m., stay in the shade or cover up with clothing and a hat with a wide brim. Wear sunglasses that block UV rays. Even when it's cloudy, put broad-spectrum sunscreen (SPF 30 or higher) on any   exposed skin.   See a dentist one or two times a year for checkups and to have your teeth cleaned.   See an eye doctor once per year for an eye exam.   Wear a seat belt in the car.  When should you call for help?  Watch closely for changes in your health, and be sure to contact your provider if you have any problems or symptoms that concern you.  We will plan to see you back in 12 months for your next physical  You will receive a survey after today's visit either digitally by e-mail or paper by USPS mail. Your experiences and  feedback matter to [place].  Please respond so we know how we are doing as we provide care for you.  Call us with any questions/concerns/needs.  It is my goal to be available to you for your health concerns.  Thanks for choosing me to be a partner in your healthcare needs!  Charlaine Dalton, FNP-C Family Nurse Practitioner [facility] Phone: [phone]

## 2020-08-16 NOTE — Assessment & Plan Note (Signed)
OSA untreated.  Has CPAP at home, reports does not see a difference in symptoms with/without using this.  Offered and declined referral to sleep medicine for re-evaluation of mask/fitting/titration.

## 2020-08-16 NOTE — Assessment & Plan Note (Signed)
Offered and declined influenza and tetanus immunization.

## 2020-08-16 NOTE — Progress Notes (Signed)
Subjective:    Patient ID: Adam Schultz, male    DOB: 06/01/1981, 39 y.o.   MRN: [id number]  Adam ReeDerek Dinapoli is a 39 y.o. male presenting on 08/16/2020 for Annual Exam  HPI  HEALTH MAINTENANCE:  Weight/BMI: Has lost 24 lbs since last visit in August, is working on lifestyle changes with diet and exercise Physical activity: Working with a Psychologist, educationaltrainer Diet: Working with a Product/process development scientisttrainer Seatbelt: Yes Sunscreen: Sometimes HIV & Hep C Screening: Offered and declined  GC/CT: Offered and declined  Optometry: Yearly  Dentistry: Every 3 months   IMMUNIZATIONS: Tetanus: Declines Influenza: Declines COVID: Declines  Depression screen PHQ 2/9 05/12/2020  Decreased Interest 0  Down, Depressed, Hopeless 0  PHQ - 2 Score 0    Past Medical History:  Diagnosis Date  . Blood clot of neck vein   . Obesity   . Right tennis elbow   . Sleep apnea   . URI, acute    Past Surgical History:  Procedure Laterality Date  . none     Social History   Socioeconomic History  . Marital status: Married    Spouse name: Not on file  . Number of children: Not on file  . Years of education: Not on file  . Highest education level: Not on file  Occupational History  . Not on file  Tobacco Use  . Smoking status: Never Smoker  . Smokeless tobacco: Never Used  Vaping Use  . Vaping Use: Never used  Substance and Sexual Activity  . Alcohol use: Not Currently    Alcohol/week: 3.0 standard drinks    Types: 3 Cans of beer per week  . Drug use: No  . Sexual activity: Not on file  Other Topics Concern  . Not on file  Social History Narrative  . Not on file   Social Determinants of Health   Financial Resource Strain:   . Difficulty of Paying Living Expenses: Not on file  Food Insecurity:   . Worried About Programme researcher, broadcasting/film/videounning Out of Food in the Last Year: Not on file  . Ran Out of Food in the Last Year: Not on file  Transportation Needs:   . Lack of Transportation (Medical): Not on file  . Lack of Transportation  (Non-Medical): Not on file  Physical Activity:   . Days of Exercise per Week: Not on file  . Minutes of Exercise per Session: Not on file  Stress:   . Feeling of Stress : Not on file  Social Connections:   . Frequency of Communication with Friends and Family: Not on file  . Frequency of Social Gatherings with Friends and Family: Not on file  . Attends Religious Services: Not on file  . Active Member of Clubs or Organizations: Not on file  . Attends BankerClub or Organization Meetings: Not on file  . Marital Status: Not on file  Intimate Partner Violence:   . Fear of Current or Ex-Partner: Not on file  . Emotionally Abused: Not on file  . Physically Abused: Not on file  . Sexually Abused: Not on file   Family History  Problem Relation Age of Onset  . Hyperlipidemia Father   . Hypertension Father   . Transient ischemic attack Father   . Stroke Father   . Cancer - Other Mother    Current Outpatient Medications on File Prior to Visit  Medication Sig  . aspirin 81 MG chewable tablet Chew by mouth daily.  . Cholecalciferol (VITAMIN D3) 10 MCG (400 UNIT) CAPS Take  by mouth.  Marland Kitchen ketoconazole (NIZORAL) 2 % shampoo Apply topically.  . Omega-3 Fatty Acids (FISH OIL) 1000 MG CAPS Take by mouth.   No current facility-administered medications on file prior to visit.    Per HPI unless specifically indicated above     Objective:    BP 120/62 (BP Location: Right Arm, Patient Position: Sitting, Cuff Size: Large)   Pulse (!) 52   Temp 97.8 F (36.6 C) (Oral)   Resp 17   Ht 5' 11.5" (1.816 m)   Wt 224 lb (101.6 kg)   SpO2 100%   BMI 30.81 kg/m   Wt Readings from Last 3 Encounters:  08/16/20 224 lb (101.6 kg)  05/12/20 248 lb 6.4 oz (112.7 kg)  04/14/16 248 lb (112.5 kg)    Physical Exam Vitals and nursing note reviewed.  Constitutional:      General: He is not in acute distress.    Appearance: Normal appearance. He is well-developed and well-groomed. He is obese. He is not  ill-appearing or toxic-appearing.  HENT:     Head: Normocephalic and atraumatic.     Right Ear: Tympanic membrane, ear canal and external ear normal. There is no impacted cerumen.     Left Ear: Tympanic membrane, ear canal and external ear normal. There is no impacted cerumen.     Nose: Nose normal. No congestion or rhinorrhea.     Mouth/Throat:     Mouth: Mucous membranes are moist.     Pharynx: Oropharynx is clear. No oropharyngeal exudate or posterior oropharyngeal erythema.  Eyes:     General: Lids are normal. Vision grossly intact. No scleral icterus.       Right eye: No discharge or hordeolum.        Left eye: No discharge or hordeolum.     Extraocular Movements: Extraocular movements intact.     Conjunctiva/sclera: Conjunctivae normal.     Pupils: Pupils are equal, round, and reactive to light.  Neck:     Thyroid: No thyroid mass, thyromegaly or thyroid tenderness.  Cardiovascular:     Rate and Rhythm: Normal rate and regular rhythm.     Pulses: Normal pulses.          Dorsalis pedis pulses are 2+ on the right side and 2+ on the left side.     Heart sounds: Normal heart sounds. No murmur heard.  No friction rub. No gallop.   Pulmonary:     Effort: Pulmonary effort is normal. No respiratory distress.     Breath sounds: Normal breath sounds.  Abdominal:     General: Abdomen is flat. Bowel sounds are normal. There is no distension or abdominal bruit.     Palpations: Abdomen is soft. There is no hepatomegaly, splenomegaly or mass.     Tenderness: There is no abdominal tenderness. There is no guarding or rebound.     Hernia: No hernia is present.  Genitourinary:    Comments: [place] Musculoskeletal:        General: Normal range of motion.     Cervical back: Normal range of motion and neck supple. No tenderness.     Right lower leg: No edema.     Left lower leg: No edema.     Comments: Normal tone, 5/5 strength BUE & BLE  Feet:     Right foot:     Skin integrity: Skin  integrity normal.     Left foot:     Skin integrity: Skin integrity normal.  Lymphadenopathy:  Head:     Right side of head: No submental, submandibular, tonsillar, preauricular, posterior auricular or occipital adenopathy.     Left side of head: No submental, submandibular, tonsillar, preauricular, posterior auricular or occipital adenopathy.     Cervical: No cervical adenopathy.  Skin:    General: Skin is warm and dry.     Capillary Refill: Capillary refill takes less than 2 seconds.  Neurological:     General: No focal deficit present.     Mental Status: He is alert and oriented to person, place, and time.     Cranial Nerves: Cranial nerves are intact. No cranial nerve deficit.     Sensory: Sensation is intact.     Motor: Motor function is intact. No weakness.     Coordination: Coordination is intact.     Gait: Gait is intact. Gait normal.  Psychiatric:        Attention and Perception: Attention and perception normal.        Mood and Affect: Mood and affect normal.        Speech: Speech normal.        Behavior: Behavior normal. Behavior is cooperative.        Thought Content: Thought content normal.        Cognition and Memory: Cognition and memory normal.        Judgment: Judgment normal.     Results for orders placed or performed in visit on 05/12/20  CBC with Differential  Result Value Ref Range   WBC 8.0 3.8 - 10.8 Thousand/uL   RBC 5.33 4.20 - 5.80 Million/uL   Hemoglobin 16.2 13.2 - 17.1 g/dL   HCT 38.4 38 - 50 %   MCV 90.6 80.0 - 100.0 fL   MCH 30.4 27.0 - 33.0 pg   MCHC 33.5 32.0 - 36.0 g/dL   RDW 66.5 99.3 - 57.0 %   Platelets 162 140 - 400 Thousand/uL   MPV 10.9 7.5 - 12.5 fL   Neutro Abs 4,952 1,500 - 7,800 cells/uL   Lymphs Abs 2,288 850 - 3,900 cells/uL   Absolute Monocytes 600 200 - 950 cells/uL   Eosinophils Absolute 128 15.0 - 500.0 cells/uL   Basophils Absolute 32 0.0 - 200.0 cells/uL   Neutrophils Relative % 61.9 %   Total Lymphocyte 28.6 %    Monocytes Relative 7.5 %   Eosinophils Relative 1.6 %   Basophils Relative 0.4 %  COMPLETE METABOLIC PANEL WITH GFR  Result Value Ref Range   Glucose, Bld 88 65 - 99 mg/dL   BUN 15 7 - 25 mg/dL   Creat 1.77 9.39 - 0.30 mg/dL   GFR, Est Non African American 75 > OR = 60 mL/min/1.82m2   GFR, Est African American 87 > OR = 60 mL/min/1.93m2   BUN/Creatinine Ratio NOT APPLICABLE 6 - 22 (calc)   Sodium 139 135 - 146 mmol/L   Potassium 4.2 3.5 - 5.3 mmol/L   Chloride 101 98 - 110 mmol/L   CO2 26 20 - 32 mmol/L   Calcium 10.2 8.6 - 10.3 mg/dL   Total Protein 8.0 6.1 - 8.1 g/dL   Albumin 5.2 (H) 3.6 - 5.1 g/dL   Globulin 2.8 1.9 - 3.7 g/dL (calc)   AG Ratio 1.9 1.0 - 2.5 (calc)   Total Bilirubin 0.8 0.2 - 1.2 mg/dL   Alkaline phosphatase (APISO) 47 36 - 130 U/L   AST 21 10 - 40 U/L   ALT 16 9 - 46 U/L  Lipid  Profile  Result Value Ref Range   Cholesterol 251 (H) <200 mg/dL   HDL 49 > OR = 40 mg/dL   Triglycerides 762 <831 mg/dL   LDL Cholesterol (Calc) 178 (H) mg/dL (calc)   Total CHOL/HDL Ratio 5.1 (H) <5.0 (calc)   Non-HDL Cholesterol (Calc) 202 (H) <130 mg/dL (calc)  Thyroid Panel With TSH  Result Value Ref Range   T3 Uptake 33 22 - 35 %   T4, Total 7.1 4.9 - 10.5 mcg/dL   Free Thyroxine Index 2.3 1.4 - 3.8   TSH 2.33 0.40 - 4.50 mIU/L      Assessment & Plan:   Problem List Items Addressed This Visit      Respiratory   OSA (obstructive sleep apnea)    OSA untreated.  Has CPAP at home, reports does not see a difference in symptoms with/without using this.  Offered and declined referral to sleep medicine for re-evaluation of mask/fitting/titration.        Other   Annual physical exam - Primary    Annual physical exam without new findings.  Well adult with no acute concerns.  Plan: 1. Obtain health maintenance screenings as above according to age. - Increase physical activity to 30 minutes most days of the week.  - Eat healthy diet high in vegetables and fruits; low in  refined carbohydrates. - Screening labs and tests as ordered 2. Return 1 year for annual physical.       Immunization declined    Offered and declined influenza and tetanus immunization.         No orders of the defined types were placed in this encounter.   Follow up plan: Return in about 1 year (around 08/16/2021) for CPE.  Charlaine Dalton, FNP-C Family Nurse Practitioner [facility] 08/16/2020, 8:58 AM

## 2020-10-13 ENCOUNTER — Ambulatory Visit
Admission: RE | Admit: 2020-10-13 | Discharge: 2020-10-13 | Disposition: A | Discharge status: Home / Self Care | Payer: 59 | Source: Ambulatory Visit | Attending: Family Medicine | Admitting: Family Medicine

## 2020-10-13 ENCOUNTER — Ambulatory Visit (INDEPENDENT_AMBULATORY_CARE_PROVIDER_SITE_OTHER): Payer: Managed Care, Other (non HMO) | Admitting: Family Medicine

## 2020-10-13 ENCOUNTER — Encounter: Payer: Self-pay | Admitting: Family Medicine

## 2020-10-13 ENCOUNTER — Other Ambulatory Visit: Payer: Self-pay

## 2020-10-13 VITALS — BP 138/75 | HR 75 | Temp 98.7°F | Ht 71.5 in | Wt 220.0 lb

## 2020-10-13 DIAGNOSIS — M79645 Pain in left finger(s): Secondary | ICD-10-CM | POA: Insufficient documentation

## 2020-10-13 DIAGNOSIS — S6992XA Unspecified injury of left wrist, hand and finger(s), initial encounter: Secondary | ICD-10-CM | POA: Diagnosis present

## 2020-10-13 NOTE — Assessment & Plan Note (Signed)
Left 4th digit injury, with subungul hematoma.  Nail trephination completed with some swelling and blood removed.  Swelling decreased after trephination.  Xray taken and showing fracture that is aligned.  Discussed preliminary results with patient and provided with CD images.  Discussed orthopedic urgent care walk in location and hours of urgent care if symptoms worsen or fail to improve with conservative treatment.

## 2020-10-13 NOTE — Assessment & Plan Note (Signed)
See finger pain AP

## 2020-10-13 NOTE — Patient Instructions (Signed)
We have done a nail trephination to help alleviate some of the pressure under your left 4th finger nail.  We have done an xray on the finger and we will contact once we receive the results from the xray department as this may need to go to Urgent Care for further evaluation with Orthopedics  Can take ibuprofen 600-800mg  every 6-8 hours as needed for discomfort  We will plan to see you back if your symptoms worsen or fail to improve  You will receive a survey after today's visit either digitally by e-mail or paper by USPS mail. Your experiences and feedback matter to [place].  Please respond so we know how we are doing as we provide care for you.  Call us with any questions/concerns/needs.  It is my goal to be available to you for your health concerns.  Thanks for choosing me to be a partner in your healthcare needs!  Charlaine Dalton, FNP-C Family Nurse Practitioner [facility] Phone: [phone]

## 2020-10-13 NOTE — Progress Notes (Signed)
Subjective:    Patient ID: Adam Schultz, male    DOB: February 23, 1981, 40 y.o.   MRN: [id number]  Adam Schultz is a 40 y.o. male presenting on 10/13/2020 for Finger Injury (Ringer finger was smashed between dumbells. X 1 day ago. Intermittent throbbing, sensitivity to the touch. Blood blister on the tip of the finger tip x 2 cm)   HPI  Adam Schultz presents to clinic for concerns of left ring finger injury x 1 day.  Reports yesterday he had caught the finger in between 2 dumbbells (that weighed between 45-65 lbs).  Reports that he has increased pain and swelling.  Has blood blister on tip of finger.  Has not taken anything for the symptoms.  Depression screen PHQ 2/9 05/12/2020  Decreased Interest 0  Down, Depressed, Hopeless 0  PHQ - 2 Score 0    Social History   Tobacco Use  . Smoking status: Never Smoker  . Smokeless tobacco: Never Used  Vaping Use  . Vaping Use: Never used  Substance Use Topics  . Alcohol use: Not Currently    Alcohol/week: 3.0 standard drinks    Types: 3 Cans of beer per week  . Drug use: No    Review of Systems  Constitutional: Negative.   HENT: Negative.   Eyes: Negative.   Respiratory: Negative.   Cardiovascular: Negative.   Gastrointestinal: Negative.   Endocrine: Negative.   Genitourinary: Negative.   Musculoskeletal: Positive for myalgias. Negative for arthralgias, back pain, gait problem, joint swelling, neck pain and neck stiffness.  Skin: Negative.   Allergic/Immunologic: Negative.   Neurological: Negative.   Hematological: Negative.   Psychiatric/Behavioral: Negative.    Per HPI unless specifically indicated above     Objective:    BP 138/75 (BP Location: Right Arm, Patient Position: Sitting, Cuff Size: Normal)   Pulse 75   Temp 98.7 F (37.1 C) (Temporal)   Ht 5' 11.5" (1.816 m)   Wt 220 lb (99.8 kg)   SpO2 100%   BMI 30.26 kg/m   Wt Readings from Last 3 Encounters:  10/13/20 220 lb (99.8 kg)  08/16/20 224 lb (101.6 kg)  05/12/20  248 lb 6.4 oz (112.7 kg)    Physical Exam Vitals and nursing note reviewed.  Constitutional:      General: He is not in acute distress.    Appearance: Normal appearance. He is well-developed and well-groomed. He is not ill-appearing or toxic-appearing.  HENT:     Head: Normocephalic and atraumatic.     Nose:     Comments: Lesia Sago is in place, covering mouth and nose. Eyes:     General:        Right eye: No discharge.        Left eye: No discharge.     Extraocular Movements: Extraocular movements intact.     Conjunctiva/sclera: Conjunctivae normal.     Pupils: Pupils are equal, round, and reactive to light.  Pulmonary:     Effort: Pulmonary effort is normal. No respiratory distress.  Musculoskeletal:     Comments: Left 4th digit finger with subungal hematoma, swelling behind tip of finger.  Skin:    General: Skin is warm and dry.     Capillary Refill: Capillary refill takes less than 2 seconds.  Neurological:     General: No focal deficit present.     Mental Status: He is alert and oriented to person, place, and time.  Psychiatric:        Attention and Perception: Attention and  perception normal.        Mood and Affect: Mood and affect normal.        Speech: Speech normal.        Behavior: Behavior normal. Behavior is cooperative.        Thought Content: Thought content normal.        Cognition and Memory: Cognition and memory normal.    Results for orders placed or performed in visit on 05/12/20  CBC with Differential  Result Value Ref Range   WBC 8.0 3.8 - 10.8 Thousand/uL   RBC 5.33 4.20 - 5.80 Million/uL   Hemoglobin 16.2 13.2 - 17.1 g/dL   HCT 08.6 76.1 - 95.0 %   MCV 90.6 80.0 - 100.0 fL   MCH 30.4 27.0 - 33.0 pg   MCHC 33.5 32.0 - 36.0 g/dL   RDW 93.2 67.1 - 24.5 %   Platelets 162 140 - 400 Thousand/uL   MPV 10.9 7.5 - 12.5 fL   Neutro Abs 4,952 1,500 - 7,800 cells/uL   Lymphs Abs 2,288 850 - 3,900 cells/uL   Absolute Monocytes 600 200 - 950 cells/uL    Eosinophils Absolute 128 15 - 500 cells/uL   Basophils Absolute 32 0 - 200 cells/uL   Neutrophils Relative % 61.9 %   Total Lymphocyte 28.6 %   Monocytes Relative 7.5 %   Eosinophils Relative 1.6 %   Basophils Relative 0.4 %  COMPLETE METABOLIC PANEL WITH GFR  Result Value Ref Range   Glucose, Bld 88 65 - 99 mg/dL   BUN 15 7 - 25 mg/dL   Creat 8.09 9.83 - 3.82 mg/dL   GFR, Est Non African American 75 > OR = 60 mL/min/1.15m2   GFR, Est African American 87 > OR = 60 mL/min/1.6m2   BUN/Creatinine Ratio NOT APPLICABLE 6 - 22 (calc)   Sodium 139 135 - 146 mmol/L   Potassium 4.2 3.5 - 5.3 mmol/L   Chloride 101 98 - 110 mmol/L   CO2 26 20 - 32 mmol/L   Calcium 10.2 8.6 - 10.3 mg/dL   Total Protein 8.0 6.1 - 8.1 g/dL   Albumin 5.2 (H) 3.6 - 5.1 g/dL   Globulin 2.8 1.9 - 3.7 g/dL (calc)   AG Ratio 1.9 1.0 - 2.5 (calc)   Total Bilirubin 0.8 0.2 - 1.2 mg/dL   Alkaline phosphatase (APISO) 47 36 - 130 U/L   AST 21 10 - 40 U/L   ALT 16 9 - 46 U/L  Lipid Profile  Result Value Ref Range   Cholesterol 251 (H) <200 mg/dL   HDL 49 > OR = 40 mg/dL   Triglycerides 505 <397 mg/dL   LDL Cholesterol (Calc) 178 (H) mg/dL (calc)   Total CHOL/HDL Ratio 5.1 (H) <5.0 (calc)   Non-HDL Cholesterol (Calc) 202 (H) <130 mg/dL (calc)  Thyroid Panel With TSH  Result Value Ref Range   T3 Uptake 33 22 - 35 %   T4, Total 7.1 4.9 - 10.5 mcg/dL   Free Thyroxine Index 2.3 1.4 - 3.8   TSH 2.33 0.40 - 4.50 mIU/L      Assessment & Plan:   Problem List Items Addressed This Visit      Musculoskeletal and Integument   Injury to fingernail of left hand    See finger pain AP      Relevant Orders   DG Finger Ring Left     Other   Finger pain, left - Primary    Left 4th digit injury,  with subungul hematoma.  Nail trephination completed with some swelling and blood removed.  Swelling decreased after trephination.  Xray taken and showing fracture that is aligned.  Discussed preliminary results with patient and  provided with CD images.  Discussed orthopedic urgent care walk in location and hours of urgent care if symptoms worsen or fail to improve with conservative treatment.      Relevant Orders   DG Finger Ring Left      No orders of the defined types were placed in this encounter.  Follow up plan: Return if symptoms worsen or fail to improve.   Charlaine Dalton, FNP Family Nurse Practitioner [facility] 10/13/2020, 3:01 PM

## 2021-10-28 ENCOUNTER — Other Ambulatory Visit: Payer: Self-pay

## 2021-10-28 ENCOUNTER — Ambulatory Visit (INDEPENDENT_AMBULATORY_CARE_PROVIDER_SITE_OTHER): Payer: Managed Care, Other (non HMO) | Admitting: Family Medicine

## 2021-10-28 ENCOUNTER — Encounter: Payer: Self-pay | Admitting: Family Medicine

## 2021-10-28 VITALS — BP 151/85 | HR 62 | Temp 98.4°F | Wt 244.0 lb

## 2021-10-28 DIAGNOSIS — J0110 Acute frontal sinusitis, unspecified: Secondary | ICD-10-CM | POA: Diagnosis not present

## 2021-10-28 DIAGNOSIS — H66002 Acute suppurative otitis media without spontaneous rupture of ear drum, left ear: Secondary | ICD-10-CM

## 2021-10-28 MED ORDER — FLUTICASONE PROPIONATE 50 MCG/ACT NA SUSP: 2.0000 | Freq: Every day | NASAL | 3 refills | Status: DC

## 2021-10-28 MED ORDER — AMOXICILLIN-POT CLAVULANATE 875-125 MG PO TABS: 1.0000 | ORAL_TABLET | Freq: Two times a day (BID) | ORAL | 0 refills | Status: DC

## 2021-10-28 NOTE — Progress Notes (Signed)
Subjective:    Patient ID: Adam Schultz, male    DOB: Nov 24, 1980, 41 y.o.   MRN: [id number]  Adam Schultz is a 41 y.o. male presenting on 10/28/2021 for Sinusitis (Started 10/24/2021)  Previous PCP Danielle Rankin, FNP   HPI  Left Ear Infection Otitis Media Sinusitis Reports onset within past 1 week Admits sinus pressure drainage, initially some thicker green sinus drainage Left ear feels full or muffled and pain, concerned about fluid Admits some cough with throat tickling Worse at night. Sleeps with head propped up. Tried Mucinex some relief. Denies fever chills sweats, nausea vomiting abdominal pain, headache   Depression screen [facility] 2/9 10/28/2021 05/12/2020  Decreased Interest 0 0  Down, Depressed, Hopeless 0 0  PHQ - 2 Score 0 0  Altered sleeping 1 -  Tired, decreased energy 1 -  Change in appetite 0 -  Feeling bad or failure about yourself  0 -  Trouble concentrating 0 -  Moving slowly or fidgety/restless 0 -  Suicidal thoughts 0 -  PHQ-9 Score 2 -  Difficult doing work/chores Not difficult at all -    Social History   Tobacco Use   Smoking status: Never   Smokeless tobacco: Never  Vaping Use   Vaping Use: Never used  Substance Use Topics   Alcohol use: Not Currently    Alcohol/week: 3.0 standard drinks    Types: 3 Cans of beer per week   Drug use: No    Review of Systems Per HPI unless specifically indicated above     Objective:    BP (!) 151/85 (BP Location: Left Arm, Patient Position: Sitting, Cuff Size: Large)    Pulse 62    Temp 98.4 F (36.9 C) (Temporal)    Wt 244 lb (110.7 kg)    SpO2 98%    BMI 33.56 kg/m   Wt Readings from Last 3 Encounters:  10/28/21 244 lb (110.7 kg)  10/13/20 220 lb (99.8 kg)  08/16/20 224 lb (101.6 kg)    Physical Exam Vitals and nursing note reviewed.  Constitutional:      General: He is not in acute distress.    Appearance: Normal appearance. He is well-developed. He is not diaphoretic.     Comments: Well-appearing,  comfortable, cooperative  HENT:     Head: Normocephalic and atraumatic.     Ears:     Comments: Left TM bulging erythema R TM effusion Eyes:     General:        Right eye: No discharge.        Left eye: No discharge.     Conjunctiva/sclera: Conjunctivae normal.  Cardiovascular:     Rate and Rhythm: Normal rate.  Pulmonary:     Effort: Pulmonary effort is normal.  Skin:    General: Skin is warm and dry.     Findings: No erythema or rash.  Neurological:     Mental Status: He is alert and oriented to person, place, and time.  Psychiatric:        Mood and Affect: Mood normal.        Behavior: Behavior normal.        Thought Content: Thought content normal.     Comments: Well groomed, good eye contact, normal speech and thoughts     Results for orders placed or performed in visit on 05/12/20  CBC with Differential  Result Value Ref Range   WBC 8.0 3.8 - 10.8 Thousand/uL   RBC 5.33 4.20 - 5.80 Million/uL  Hemoglobin 16.2 13.2 - 17.1 g/dL   HCT 80.3 21.2 - 24.8 %   MCV 90.6 80.0 - 100.0 fL   MCH 30.4 27.0 - 33.0 pg   MCHC 33.5 32.0 - 36.0 g/dL   RDW 25.0 03.7 - 04.8 %   Platelets 162 140 - 400 Thousand/uL   MPV 10.9 7.5 - 12.5 fL   Neutro Abs 4,952 1,500 - 7,800 cells/uL   Lymphs Abs 2,288 850 - 3,900 cells/uL   Absolute Monocytes 600 200 - 950 cells/uL   Eosinophils Absolute 128 15 - 500 cells/uL   Basophils Absolute 32 0 - 200 cells/uL   Neutrophils Relative % 61.9 %   Total Lymphocyte 28.6 %   Monocytes Relative 7.5 %   Eosinophils Relative 1.6 %   Basophils Relative 0.4 %  COMPLETE METABOLIC PANEL WITH GFR  Result Value Ref Range   Glucose, Bld 88 65 - 99 mg/dL   BUN 15 7 - 25 mg/dL   Creat 8.89 1.69 - 4.50 mg/dL   GFR, Est Non African American 75 > OR = 60 mL/min/1.76m2   GFR, Est African American 87 > OR = 60 mL/min/1.66m2   BUN/Creatinine Ratio NOT APPLICABLE 6 - 22 (calc)   Sodium 139 135 - 146 mmol/L   Potassium 4.2 3.5 - 5.3 mmol/L   Chloride 101 98 -  110 mmol/L   CO2 26 20 - 32 mmol/L   Calcium 10.2 8.6 - 10.3 mg/dL   Total Protein 8.0 6.1 - 8.1 g/dL   Albumin 5.2 (H) 3.6 - 5.1 g/dL   Globulin 2.8 1.9 - 3.7 g/dL (calc)   AG Ratio 1.9 1.0 - 2.5 (calc)   Total Bilirubin 0.8 0.2 - 1.2 mg/dL   Alkaline phosphatase (APISO) 47 36 - 130 U/L   AST 21 10 - 40 U/L   ALT 16 9 - 46 U/L  Lipid Profile  Result Value Ref Range   Cholesterol 251 (H) <200 mg/dL   HDL 49 > OR = 40 mg/dL   Triglycerides 388 <828 mg/dL   LDL Cholesterol (Calc) 178 (H) mg/dL (calc)   Total CHOL/HDL Ratio 5.1 (H) <5.0 (calc)   Non-HDL Cholesterol (Calc) 202 (H) <130 mg/dL (calc)  Thyroid Panel With TSH  Result Value Ref Range   T3 Uptake 33 22 - 35 %   T4, Total 7.1 4.9 - 10.5 mcg/dL   Free Thyroxine Index 2.3 1.4 - 3.8   TSH 2.33 0.40 - 4.50 mIU/L      Assessment & Plan:   Problem List Items Addressed This Visit   None Visit Diagnoses     Non-recurrent acute suppurative otitis media of left ear without spontaneous rupture of tympanic membrane    -  Primary   Relevant Medications   amoxicillin-clavulanate (AUGMENTIN) 875-125 MG tablet   Acute non-recurrent frontal sinusitis       Relevant Medications   amoxicillin-clavulanate (AUGMENTIN) 875-125 MG tablet   fluticasone (FLONASE) 50 MCG/ACT nasal spray       L AOM Underlying sinusitis may have started viral Start Augmentin x 10 day course Start nasal steroid Flonase 2 sprays in each nostril daily for 4-6 weeks, may repeat course seasonally or as needed Follow up if not improving.  Meds ordered this encounter  Medications   amoxicillin-clavulanate (AUGMENTIN) 875-125 MG tablet    Sig: Take 1 tablet by mouth 2 (two) times daily.    Dispense:  20 tablet    Refill:  0   fluticasone (FLONASE) 50 MCG/ACT  nasal spray    Sig: Place 2 sprays into both nostrils daily. Use for 4-6 weeks then stop and use seasonally or as needed.    Dispense:  16 g    Refill:  3      Follow up plan: Return in about 3  months (around 01/25/2022) for 3 month Annual Physical w/ Rene Kocher (establish) AM apt fasting lab AFTER.    Saralyn Pilar, DO [facility] 10/28/2021, 10:08 AM

## 2021-10-28 NOTE — Patient Instructions (Addendum)
Thank you for coming to the office today.  1. It sounds like you have a LEFT Ear Infection and Sinusitis (Bacterial Infection) - this most likely started as an Upper Respiratory Virus that has settled into an infection. Allergies can also cause this.  - Start Augmentin 1 pill twice daily (breakfast and dinner, with food and plenty of water) for 10 days, complete entire course, do not stop early even if feeling better - Start nasal steroid Flonase 2 sprays in each nostril daily for 4-6 weeks, may repeat course seasonally or as needed  Recommend to try using Nasal Saline spray multiple times a day to help flush out congestion and clear sinuses - Improve hydration by drinking plenty of clear fluids (water, gatorade) to reduce secretions and thin congestion - Congestion draining down throat can cause irritation. May try warm herbal tea with honey, cough drops - Can take Tylenol or Ibuprofen as needed for fevers - May continue over the counter cold medicine as you are, I would not use any decongestant or mucinex longer than 7 days.  If you develop persistent fever >101F for at least 3 consecutive days, headaches with sinus pain or pressure or persistent earache, please schedule a follow-up evaluation within next few days to week.  DUE for FASTING BLOOD WORK (no food or drink after midnight before the lab appointment, only water or coffee without cream/sugar on the morning of)  SCHEDULE "Lab Only" visit in the morning at the clinic for lab draw in 3 MONTHS   - Make sure Lab Only appointment is at about 1 week before your next appointment, so that results will be available  For Lab Results, once available within 2-3 days of blood draw, you can can log in to MyChart online to view your results and a brief explanation. Also, we can discuss results at next follow-up visit.    Please schedule a Follow-up Appointment to: Return in about 3 months (around 01/25/2022) for 3 month Annual Physical w/ Rene Kocher  (establish) AM apt fasting lab AFTER.  If you have any other questions or concerns, please feel free to call the office or send a message through MyChart. You may also schedule an earlier appointment if necessary.  Additionally, you may be receiving a survey about your experience at our office within a few days to 1 week by e-mail or mail. We value your feedback.  Saralyn Pilar, DO [facility], [place]

## 2021-11-02 ENCOUNTER — Ambulatory Visit: Payer: Self-pay | Admitting: *Deleted

## 2021-11-02 NOTE — Telephone Encounter (Signed)
°  Summary: Pt prescribed meds but concerned that hearing is not improving   Pt stated he has been taking the medication that was prescribed but he does not feel like his hearing is getting any better. Pt requests call back to discuss. Cb# [phone]        Chief Complaint: decreased hearing left ear, no better since starting antibiotics  Symptoms: left ear sounds like cotton ball in ear, loud sounds cause left ear sensitivity Frequency: since 10/28/21 Pertinent Negatives: Patient denies ear pain drainage from ear, no fever, no sinus issues  Disposition: [] ED /[] Urgent Care (no appt availability in office) / [] Appointment(In office/virtual)/ []  [facility]/ [x] Home Care/ [] Refused Recommended Disposition /[] [place]/ []  Follow-up with PCP Additional Notes:   Recommended to continue to complete course of antibiotics. Patient not using flonase and would like to know if using flonase is recommended to help with hear better. Patient noted having difficulty at work hearing . Reports he will call back after antibiotics complete if decreased hearing still an issue .    Reason for Disposition  Decreased hearing following an ear infection  Answer Assessment - Initial Assessment Questions 1. DESCRIPTION: "What type of hearing problem are you having? Describe it for me." (e.g., complete hearing loss, partial loss)     Left ear sounds like "megaphone over ear" when someone is close talking, left ear sounds like cotton ball in ear .  2. LOCATION: "One or both ears?" If one, ask: "Which ear?"     Left ear  3. SEVERITY: "Can you hear anything?" If Yes, ask: "What can you hear?" (e.g., ticking watch, whisper, talking)   - MILD:  Difficulty hearing soft speech, quiet library sounds, or speech from a distance or over background noise.   - MODERATE: Difficulty hearing normal speech even at closed distances.   - SEVERE: Unable to hear most normal conversation and talking; only able  to hear loud sounds such as an alarm clock.     Loud sounds , sound very loud  4. ONSET: "When did this begin?" "Did it start suddenly or come on gradually?"     Since 10/28/21 5. PATTERN: "Does this come and go, or has it been constant since it started?"     Na  6. PAIN: "Is there any pain in your ear(s)?"  (Scale 1-10; or mild, moderate, severe)   - NONE (0): no pain   - MILD (1-3): doesn't interfere with normal activities    - MODERATE (4-7): interferes with normal activities or awakens from sleep    - SEVERE (8-10): excruciating pain, unable to do any normal activities      Denies pain  7. CAUSE: "What do you think is causing this hearing problem?"     Infection  8. OTHER SYMPTOMS: "Do you have any other symptoms?" (e.g., dizziness, ringing in ears)     na 9. PREGNANCY: "Is there any chance you are pregnant?" "When was your last menstrual period?"     na  Protocols used: Hearing Loss or Change-A-AH

## 2022-01-26 ENCOUNTER — Encounter: Payer: Self-pay | Admitting: Internal Medicine

## 2022-01-26 ENCOUNTER — Ambulatory Visit (INDEPENDENT_AMBULATORY_CARE_PROVIDER_SITE_OTHER): Payer: 59 | Admitting: Internal Medicine

## 2022-01-26 VITALS — BP 126/86 | HR 66 | Temp 97.3°F | Ht 72.0 in | Wt 246.0 lb

## 2022-01-26 DIAGNOSIS — Z1159 Encounter for screening for other viral diseases: Secondary | ICD-10-CM | POA: Diagnosis not present

## 2022-01-26 DIAGNOSIS — Z0001 Encounter for general adult medical examination with abnormal findings: Secondary | ICD-10-CM

## 2022-01-26 DIAGNOSIS — E785 Hyperlipidemia, unspecified: Secondary | ICD-10-CM | POA: Insufficient documentation

## 2022-01-26 DIAGNOSIS — R5383 Other fatigue: Secondary | ICD-10-CM | POA: Diagnosis not present

## 2022-01-26 DIAGNOSIS — E7800 Pure hypercholesterolemia, unspecified: Secondary | ICD-10-CM

## 2022-01-26 DIAGNOSIS — E6609 Other obesity due to excess calories: Secondary | ICD-10-CM

## 2022-01-26 DIAGNOSIS — Z114 Encounter for screening for human immunodeficiency virus [HIV]: Secondary | ICD-10-CM

## 2022-01-26 DIAGNOSIS — K219 Gastro-esophageal reflux disease without esophagitis: Secondary | ICD-10-CM

## 2022-01-26 DIAGNOSIS — G4733 Obstructive sleep apnea (adult) (pediatric): Secondary | ICD-10-CM

## 2022-01-26 DIAGNOSIS — Z6833 Body mass index (BMI) 33.0-33.9, adult: Secondary | ICD-10-CM

## 2022-01-26 NOTE — Assessment & Plan Note (Signed)
Encouraged diet and exercise for weight loss ?

## 2022-01-26 NOTE — Progress Notes (Signed)
? ?Subjective:  ? ? Patient ID: Adam Schultz, male    DOB: February 26, 1981, 41 y.o.   MRN: [id number] ? ?HPI ? ?Patient presents to clinic today to for his annual exam.  He is establishing care with me, transferring care from [facility], NP.  ? ?OSA: He averages 8 hours of sleep per night without the use of his CPAP.  Sleep study from 05/2016 reviewed. ? ?HLD: His last LDL was 178, triglycerides 116, 04/2020.  He is taking Fish Oil OTC.  He tries to consume a low-fat diet. ? ?Flu: never ?Tetanus: > 10 year ago ?COVID: never ?Vision screening: annually ?Dentist: biannually ? ?Diet: He does eat meat. He consumes fruits and veggies. He does eat some fried foods. He drinks mostly water, tea, coffee. ?Exercise: Lift weights 3 x week ? ?Review of Systems ? ?   ?Past Medical History:  ?Diagnosis Date  ? Blood clot of neck vein   ? Obesity   ? Right tennis elbow   ? Sleep apnea   ? URI, acute   ? ? ?Current Outpatient Medications  ?Medication Sig Dispense Refill  ? amoxicillin-clavulanate (AUGMENTIN) 875-125 MG tablet Take 1 tablet by mouth 2 (two) times daily. 20 tablet 0  ? aspirin 81 MG chewable tablet Chew by mouth daily.    ? Cholecalciferol (VITAMIN D3) 10 MCG (400 UNIT) CAPS Take by mouth.    ? fluticasone (FLONASE) 50 MCG/ACT nasal spray Place 2 sprays into both nostrils daily. Use for 4-6 weeks then stop and use seasonally or as needed. 16 g 3  ? ketoconazole (NIZORAL) 2 % shampoo Apply topically.    ? Omega-3 Fatty Acids (FISH OIL) 1000 MG CAPS Take by mouth.    ? ?No current facility-administered medications for this visit.  ? ? ?No Known Allergies ? ?Family History  ?Problem Relation Age of Onset  ? Hyperlipidemia Father   ? Hypertension Father   ? Transient ischemic attack Father   ? Stroke Father   ? Cancer - Other Mother   ? ? ?Social History  ? ?Socioeconomic History  ? Marital status: Married  ?  Spouse name: Not on file  ? Number of children: Not on file  ? Years of education: Not on file  ? Highest education  level: Not on file  ?Occupational History  ? Not on file  ?Tobacco Use  ? Smoking status: Never  ? Smokeless tobacco: Never  ?Vaping Use  ? Vaping Use: Never used  ?Substance and Sexual Activity  ? Alcohol use: Not Currently  ?  Alcohol/week: 3.0 standard drinks  ?  Types: 3 Cans of beer per week  ? Drug use: No  ? Sexual activity: Not on file  ?Other Topics Concern  ? Not on file  ?Social History Narrative  ? Not on file  ? ?Social Determinants of Health  ? ?Financial Resource Strain: Not on file  ?Food Insecurity: Not on file  ?Transportation Needs: Not on file  ?Physical Activity: Not on file  ?Stress: Not on file  ?Social Connections: Not on file  ?Intimate Partner Violence: Not on file  ? ? ? ?Constitutional: Pt reports fatigue. Denies fever, malaise, headache or abrupt weight changes.  ?HEENT: Denies eye pain, eye redness, ear pain, ringing in the ears, wax buildup, runny nose, nasal congestion, bloody nose, or sore throat. ?Respiratory: Denies difficulty breathing, shortness of breath, cough or sputum production.   ?Cardiovascular: Denies chest pain, chest tightness, palpitations or swelling in the hands or feet.  ?  Gastrointestinal: Pt reports intermittent reflux. Denies abdominal pain, bloating, constipation, diarrhea or blood in the stool.  ?GU: Denies urgency, frequency, pain with urination, burning sensation, blood in urine, odor or discharge. ?Musculoskeletal: Pt reports intermittent left shoulder pain. Denies decrease in range of motion, difficulty with gait, muscle pain or joint swelling.  ?Skin: Denies redness, rashes, lesions or ulcercations.  ?Neurological: Denies dizziness, difficulty with memory, difficulty with speech or problems with balance and coordination.  ?Psych: Denies anxiety, depression, SI/HI. ? ?No other specific complaints in a complete review of systems (except as listed in HPI above). ? ?Objective:  ? Physical Exam ? ?BP 126/86 (BP Location: Right Arm, Patient Position: Sitting,  Cuff Size: Large)   Pulse 66   Temp (!) 97.3 ?F (36.3 ?C) (Temporal)   Ht 6' (1.829 m)   Wt 246 lb (111.6 kg)   SpO2 100%   BMI 33.36 kg/m?  ? ?Wt Readings from Last 3 Encounters:  ?10/28/21 244 lb (110.7 kg)  ?10/13/20 220 lb (99.8 kg)  ?08/16/20 224 lb (101.6 kg)  ? ? ?General: Appears his stated age, obese, in NAD. ?Skin: Warm, dry and intact.  ?HEENT: Head: normal shape and size; Eyes: sclera white, no icterus, conjunctiva pink, PERRLA and EOMs intact;  ?Neck:  Neck supple, trachea midline. No masses, lumps or thyromegaly present.  ?Cardiovascular: Normal rate and rhythm. S1,S2 noted.  No murmur, rubs or gallops noted. No JVD or BLE edema.  ?Pulmonary/Chest: Normal effort and positive vesicular breath sounds. No respiratory distress. No wheezes, rales or ronchi noted.  ?Abdomen: Soft and nontender. Normal bowel sounds. ?Musculoskeletal: Strength 5/5 BUE/BLE. No difficulty with gait.  ?Neurological: Alert and oriented. Cranial nerves II-XII grossly intact. Coordination normal.  ?Psychiatric: Mood and affect normal. Behavior is normal. Judgment and thought content normal.  ? ? ? ?BMET ?   ?Component Value Date/Time  ? NA 139 05/12/2020 1055  ? NA 138 01/18/2016 0944  ? K 4.2 05/12/2020 1055  ? CL 101 05/12/2020 1055  ? CO2 26 05/12/2020 1055  ? GLUCOSE 88 05/12/2020 1055  ? BUN 15 05/12/2020 1055  ? BUN 14 01/18/2016 0944  ? CREATININE 1.20 05/12/2020 1055  ? CALCIUM 10.2 05/12/2020 1055  ? GFRNONAA 75 05/12/2020 1055  ? GFRAA 87 05/12/2020 1055  ? ? ?Lipid Panel  ?   ?Component Value Date/Time  ? CHOL 251 (H) 05/12/2020 1055  ? CHOL 226 (H) 01/18/2016 0944  ? TRIG 116 05/12/2020 1055  ? HDL 49 05/12/2020 1055  ? HDL 40 01/18/2016 0944  ? CHOLHDL 5.1 (H) 05/12/2020 1055  ? LDLCALC 178 (H) 05/12/2020 1055  ? ? ?CBC ?   ?Component Value Date/Time  ? WBC 8.0 05/12/2020 1055  ? RBC 5.33 05/12/2020 1055  ? HGB 16.2 05/12/2020 1055  ? HGB 15.3 01/18/2016 0944  ? HCT 48.3 05/12/2020 1055  ? HCT 43.4 01/18/2016 0944   ? PLT 162 05/12/2020 1055  ? PLT 186 01/18/2016 0944  ? MCV 90.6 05/12/2020 1055  ? MCV 86 01/18/2016 0944  ? MCH 30.4 05/12/2020 1055  ? MCHC 33.5 05/12/2020 1055  ? RDW 13.5 05/12/2020 1055  ? RDW 14.3 01/18/2016 0944  ? LYMPHSABS 2,288 05/12/2020 1055  ? LYMPHSABS 3.2 (H) 01/18/2016 0944  ? EOSABS 128 05/12/2020 1055  ? EOSABS 0.3 01/18/2016 0944  ? BASOSABS 32 05/12/2020 1055  ? BASOSABS 0.0 01/18/2016 0944  ? ? ?Hgb A1C ?No results found for: HGBA1C ? ? ? ? ? ?   ?Assessment &  Plan:  ? ?Preventative Health Maintenance: ? ?Encouraged him to get a flu shot in the fall ?He declines tetanus booster ?Encouraged him to get his COVID-vaccine ?Encouraged him to consume a balanced diet and exercise regimen ?Advised him to see an eye doctor and dentist annually ?We will check CBC, c-Met, lipid, A1c, HIV and hep C today ? ?Fatigue: ? ?Will check Testosterone level today ? ?RTC in 1 year, sooner if needed ?Webb Silversmith, NP ? ?

## 2022-01-26 NOTE — Assessment & Plan Note (Signed)
Try to identify foods that trigger your reflux ?Encouraged weight loss as this can help reduce reflux symptoms ?Ok to take Tums/Rolaids OTC ?

## 2022-01-26 NOTE — Assessment & Plan Note (Signed)
He is not wearing CPAP ?Encouraged weight loss as this can help reduce sleep apnea symptoms ?

## 2022-01-26 NOTE — Patient Instructions (Signed)
Health Maintenance, Male Adopting a healthy lifestyle and getting preventive care are important in promoting health and wellness. Ask your health care provider about: The right schedule for you to have regular tests and exams. Things you can do on your own to prevent diseases and keep yourself healthy. What should I know about diet, weight, and exercise? Eat a healthy diet  Eat a diet that includes plenty of vegetables, fruits, low-fat dairy products, and lean protein. Do not eat a lot of foods that are high in solid fats, added sugars, or sodium. Maintain a healthy weight Body mass index (BMI) is a measurement that can be used to identify possible weight problems. It estimates body fat based on height and weight. Your health care provider can help determine your BMI and help you achieve or maintain a healthy weight. Get regular exercise Get regular exercise. This is one of the most important things you can do for your health. Most adults should: Exercise for at least 150 minutes each week. The exercise should increase your heart rate and make you sweat (moderate-intensity exercise). Do strengthening exercises at least twice a week. This is in addition to the moderate-intensity exercise. Spend less time sitting. Even light physical activity can be beneficial. Watch cholesterol and blood lipids Have your blood tested for lipids and cholesterol at 43 years of age, then have this test every 5 years. You may need to have your cholesterol levels checked more often if: Your lipid or cholesterol levels are high. You are older than 43 years of age. You are at high risk for heart disease. What should I know about cancer screening? Many types of cancers can be detected early and may often be prevented. Depending on your health history and family history, you may need to have cancer screening at various ages. This may include screening for: Colorectal cancer. Prostate cancer. Skin cancer. Lung  cancer. What should I know about heart disease, diabetes, and high blood pressure? Blood pressure and heart disease High blood pressure causes heart disease and increases the risk of stroke. This is more likely to develop in people who have high blood pressure readings or are overweight. Talk with your health care provider about your target blood pressure readings. Have your blood pressure checked: Every 3-5 years if you are 18-39 years of age. Every year if you are 40 years old or older. If you are between the ages of 65 and 75 and are a current or former smoker, ask your health care provider if you should have a one-time screening for abdominal aortic aneurysm (AAA). Diabetes Have regular diabetes screenings. This checks your fasting blood sugar level. Have the screening done: Once every three years after age 45 if you are at a normal weight and have a low risk for diabetes. More often and at a younger age if you are overweight or have a high risk for diabetes. What should I know about preventing infection? Hepatitis B If you have a higher risk for hepatitis B, you should be screened for this virus. Talk with your health care provider to find out if you are at risk for hepatitis B infection. Hepatitis C Blood testing is recommended for: Everyone born from 1945 through 1965. Anyone with known risk factors for hepatitis C. Sexually transmitted infections (STIs) You should be screened each year for STIs, including gonorrhea and chlamydia, if: You are sexually active and are younger than 43 years of age. You are older than 43 years of age and your   health care provider tells you that you are at risk for this type of infection. Your sexual activity has changed since you were last screened, and you are at increased risk for chlamydia or gonorrhea. Ask your health care provider if you are at risk. Ask your health care provider about whether you are at high risk for HIV. Your health care provider  may recommend a prescription medicine to help prevent HIV infection. If you choose to take medicine to prevent HIV, you should first get tested for HIV. You should then be tested every 3 months for as long as you are taking the medicine. Follow these instructions at home: Alcohol use Do not drink alcohol if your health care provider tells you not to drink. If you drink alcohol: Limit how much you have to 0-2 drinks a day. Know how much alcohol is in your drink. In the [place] one drink equals one 12 oz bottle of beer (355 mL), one 5 oz glass of wine (148 mL), or one 1 oz glass of hard liquor (44 mL). Lifestyle Do not use any products that contain nicotine or tobacco. These products include cigarettes, chewing tobacco, and vaping devices, such as e-cigarettes. If you need help quitting, ask your health care provider. Do not use street drugs. Do not share needles. Ask your health care provider for help if you need support or information about quitting drugs. General instructions Schedule regular health, dental, and eye exams. Stay current with your vaccines. Tell your health care provider if: You often feel depressed. You have ever been abused or do not feel safe at home. Summary Adopting a healthy lifestyle and getting preventive care are important in promoting health and wellness. Follow your health care provider's instructions about healthy diet, exercising, and getting tested or screened for diseases. Follow your health care provider's instructions on monitoring your cholesterol and blood pressure. This information is not intended to replace advice given to you by your health care provider. Make sure you discuss any questions you have with your health care provider. Document Revised: 01/24/2021 Document Reviewed: 01/24/2021 Elsevier Patient Education  2023 Elsevier Inc.  

## 2022-01-26 NOTE — Assessment & Plan Note (Signed)
CMET and lipid profile today Encouraged him to consume a low fat diet 

## 2022-01-27 ENCOUNTER — Telehealth: Payer: Self-pay

## 2022-01-27 LAB — HEMOGLOBIN A1C
Hgb A1c MFr Bld: 5.1 % of total Hgb (ref <5.7)
Mean Plasma Glucose: 100 mg/dL
eAG (mmol/L): 5.5 mmol/L

## 2022-01-27 LAB — COMPLETE METABOLIC PANEL WITH GFR
AG Ratio: 1.8 (calc) (ref 1.0–2.5)
ALT: 15 U/L (ref 9–46)
AST: 18 U/L (ref 10–40)
Albumin: 4.6 g/dL (ref 3.6–5.1)
Alkaline phosphatase (APISO): 42 U/L (ref 36–130)
BUN: 20 mg/dL (ref 7–25)
CO2: 28 mmol/L (ref 20–32)
Calcium: 9.5 mg/dL (ref 8.6–10.3)
Chloride: 105 mmol/L (ref 98–110)
Creat: 1.29 mg/dL (ref 0.60–1.29)
Globulin: 2.5 g/dL (calc) (ref 1.9–3.7)
Glucose, Bld: 98 mg/dL (ref 65–99)
Potassium: 4.4 mmol/L (ref 3.5–5.3)
Sodium: 140 mmol/L (ref 135–146)
Total Bilirubin: 0.5 mg/dL (ref 0.2–1.2)
Total Protein: 7.1 g/dL (ref 6.1–8.1)
eGFR: 71 mL/min/{1.73_m2} (ref >=60)

## 2022-01-27 LAB — HEPATITIS C ANTIBODY
Hepatitis C Ab: NONREACTIVE
SIGNAL TO CUT-OFF: 0.09 (ref <1.00)

## 2022-01-27 LAB — LIPID PANEL
Cholesterol: 226 mg/dL — ABNORMAL HIGH (ref <200)
HDL: 51 mg/dL (ref >=40)
LDL Cholesterol (Calc): 157 mg/dL (calc) — ABNORMAL HIGH
Non-HDL Cholesterol (Calc): 175 mg/dL (calc) — ABNORMAL HIGH (ref <130)
Total CHOL/HDL Ratio: 4.4 (calc) (ref <5.0)
Triglycerides: 75 mg/dL (ref <150)

## 2022-01-27 LAB — CBC
HCT: 47.1 % (ref 38.5–50.0)
Hemoglobin: 16 g/dL (ref 13.2–17.1)
MCH: 31.1 pg (ref 27.0–33.0)
MCHC: 34 g/dL (ref 32.0–36.0)
MCV: 91.5 fL (ref 80.0–100.0)
MPV: 11 fL (ref 7.5–12.5)
Platelets: 170 10*3/uL (ref 140–400)
RBC: 5.15 10*6/uL (ref 4.20–5.80)
RDW: 12.9 % (ref 11.0–15.0)
WBC: 7.5 10*3/uL (ref 3.8–10.8)

## 2022-01-27 LAB — HIV ANTIBODY (ROUTINE TESTING W REFLEX): HIV 1&2 Ab, 4th Generation: NONREACTIVE

## 2022-01-27 LAB — TESTOSTERONE: Testosterone: 744 ng/dL (ref 250–827)

## 2022-01-27 NOTE — Telephone Encounter (Signed)
-----   Message from Lorre Munroe, NP sent at 01/27/2022 11:14 AM EDT ----- ?Cholesterol is elevated.Would recommend cholesterol lowering medication at this time. Let me know if he is agreeable and I will send this in. Blood counts are normal. No diabetes. Testosterone level is normal. Liver and kidney function is normal.  ?

## 2022-01-27 NOTE — Telephone Encounter (Signed)
noted 

## 2022-01-27 NOTE — Telephone Encounter (Signed)
Pt advised.  He is going to work on lifestyle changes first before starting a medicine.   ? ? ?Thanks,  ? ?-Vernona Rieger  ?

## 2022-02-25 IMAGING — DX DG FINGER RING 2+V*L*
3 series · 3 of 3 positions shown · non-contrast
Comparison: None.

CLINICAL DATA: Crush injury to the distal left finger yesterday.
Pain and swelling.

EXAM:
LEFT RING FINGER 2+V

[finger ap]
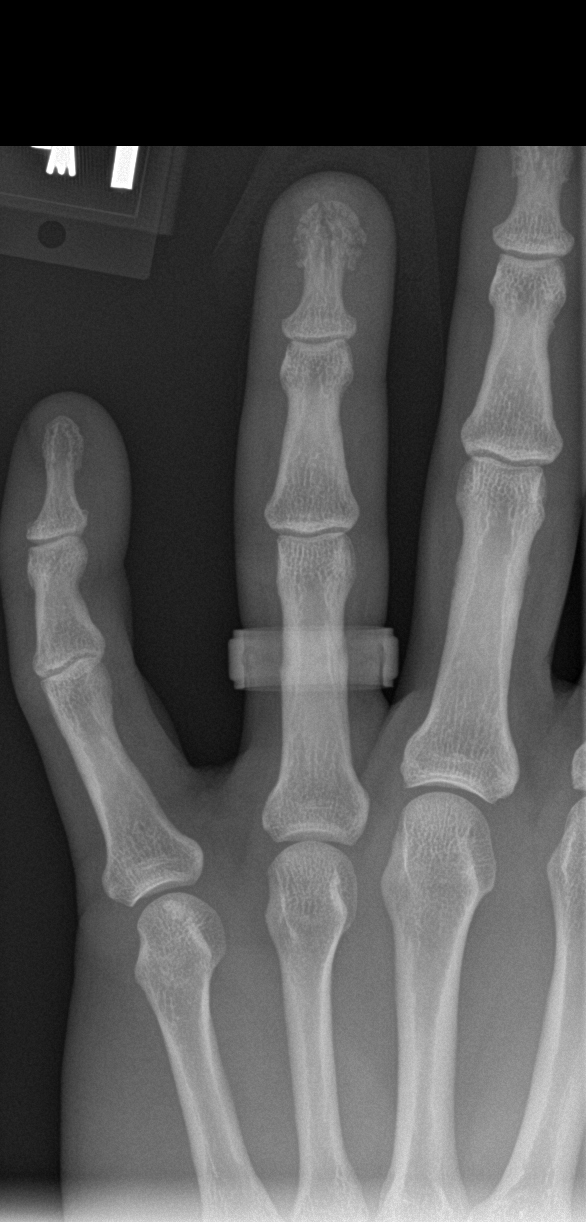

[finger obl]
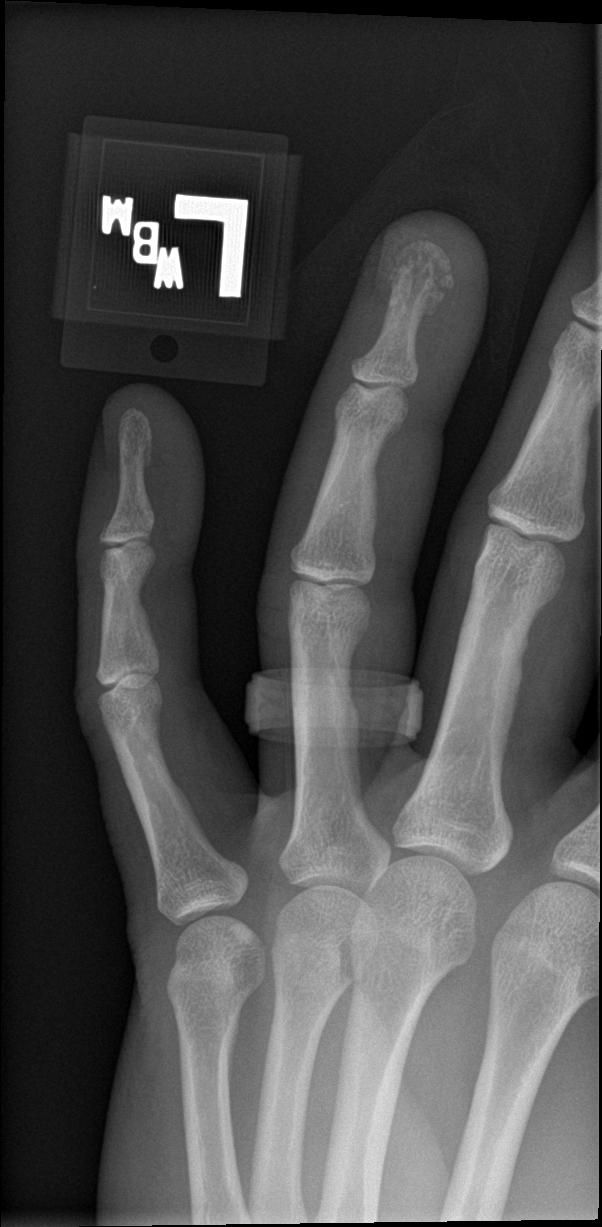

[finger lat]
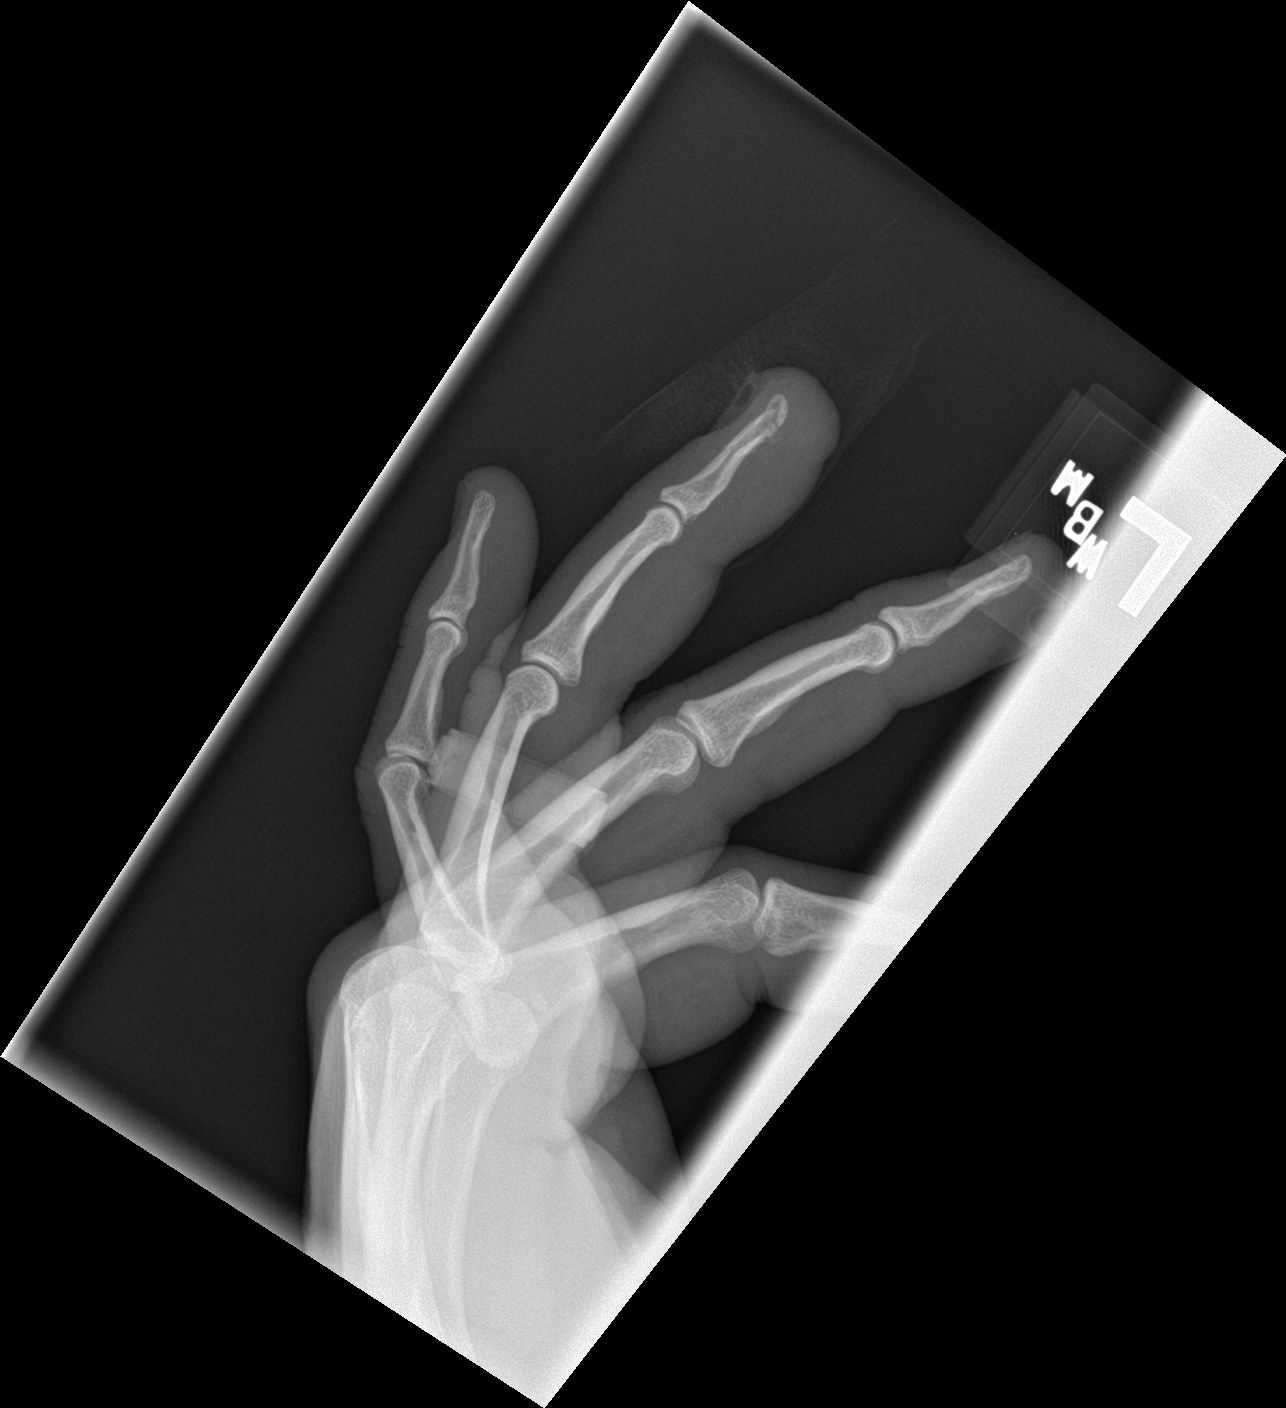

[3 of 3 positions shown; findings below may reference images not displayed]

FINDINGS: Comminuted crush fractures to the distal phalangeal tuft of the left
fourth finger with mild displacement of fracture fragments.
Alignment is intact. There is a tiny amount of gas under the nail
which suggests an open fracture. Joint spaces are normal. Soft
tissue swelling over the distal left fourth finger.
IMPRESSION: Comminuted crush fractures to the distal phalangeal tuft of the left
fourth finger. Gas beneath the nail suggests an open fracture.

## 2022-11-03 ENCOUNTER — Encounter: Payer: Self-pay | Admitting: Internal Medicine

## 2022-11-03 ENCOUNTER — Ambulatory Visit (INDEPENDENT_AMBULATORY_CARE_PROVIDER_SITE_OTHER): Payer: BLUE CROSS/BLUE SHIELD | Admitting: Internal Medicine

## 2022-11-03 VITALS — BP 118/72 | HR 94 | Temp 97.2°F | Wt 266.0 lb

## 2022-11-03 DIAGNOSIS — J069 Acute upper respiratory infection, unspecified: Secondary | ICD-10-CM

## 2022-11-03 DIAGNOSIS — R6883 Chills (without fever): Secondary | ICD-10-CM

## 2022-11-03 DIAGNOSIS — R52 Pain, unspecified: Secondary | ICD-10-CM | POA: Diagnosis not present

## 2022-11-03 LAB — POCT INFLUENZA A/B
Influenza A, POC: NEGATIVE
Influenza B, POC: NEGATIVE

## 2022-11-03 LAB — POC COVID19 BINAXNOW: SARS Coronavirus 2 Ag: NEGATIVE

## 2022-11-03 MED ORDER — PREDNISONE 10 MG PO TABS: ORAL_TABLET | ORAL | 0 refills | Status: DC

## 2022-11-03 MED ORDER — AZITHROMYCIN 250 MG PO TABS: ORAL_TABLET | ORAL | 0 refills | Status: DC

## 2022-11-03 MED ORDER — LIDOCAINE VISCOUS HCL 2 % MT SOLN: 15.0000 mL | OROMUCOSAL | 0 refills | Status: DC | PRN

## 2022-11-03 NOTE — Progress Notes (Signed)
HPI  Pt presents to the clinic today with c/o nasal congestion, sore throat and cough. This started 5 days ago. He is blowing clear mucous out of his nose. He is having difficulty swallowing. The cough is mostly non productive. He denies headache, runny nose, ear pain, shortness of breath, nausea, vomiting or diarrhea. He denies fever, but has had chills and body aches. He has tried Mucinex OTC with minimal relief of symptoms. He does not smoke. He has had sick contacts with similar symptoms.   Review of Systems      Past Medical History:  Diagnosis Date   Blood clot of neck vein    Obesity    Right tennis elbow    Sleep apnea    URI, acute     Family History  Problem Relation Age of Onset   Hyperlipidemia Father    Hypertension Father    Transient ischemic attack Father    Stroke Father    Cancer - Other Mother     Social History   Socioeconomic History   Marital status: Married    Spouse name: Not on file   Number of children: Not on file   Years of education: Not on file   Highest education level: Not on file  Occupational History   Not on file  Tobacco Use   Smoking status: Never   Smokeless tobacco: Never  Vaping Use   Vaping Use: Never used  Substance and Sexual Activity   Alcohol use: Not Currently    Alcohol/week: 3.0 standard drinks of alcohol    Types: 3 Cans of beer per week   Drug use: No   Sexual activity: Not on file  Other Topics Concern   Not on file  Social History Narrative   Not on file   Social Determinants of Health   Financial Resource Strain: Not on file  Food Insecurity: Not on file  Transportation Needs: Not on file  Physical Activity: Not on file  Stress: Not on file  Social Connections: Not on file  Intimate Partner Violence: Not on file    No Known Allergies   Constitutional: Positive fatigue, chills and body aches. Denies headache, fever or abrupt weight changes.  HEENT:  Positive nasal congestion and sore throat. Denies  eye redness, eye pain, pressure behind the eyes, facial pain,  ear pain, ringing in the ears, wax buildup, runny nose or bloody nose. Respiratory: Positive cough. Denies difficulty breathing or shortness of breath.  Cardiovascular: Denies chest pain, chest tightness, palpitations or swelling in the hands or feet.   No other specific complaints in a complete review of systems (except as listed in HPI above).  Objective:   BP 118/72 (BP Location: Left Arm, Patient Position: Sitting, Cuff Size: Large)   Pulse 94   Temp (!) 97.2 F (36.2 C) (Temporal)   Wt 266 lb (120.7 kg)   SpO2 99%   BMI 36.08 kg/m   Wt Readings from Last 3 Encounters:  01/26/22 246 lb (111.6 kg)  10/28/21 244 lb (110.7 kg)  10/13/20 220 lb (99.8 kg)     General: Appears his stated age, appears unwell but in NAD. HEENT: Head: normal shape and size, no sinus tenderness noted; Eyes: sclera white, no icterus, conjunctiva pink;  Nose: mucosa pink and moist, septum midline; Throat/Mouth: + PND. Teeth present, mucosa erythematous and moist, no exudate noted, no lesions or ulcerations noted.  Uvula swollen. Neck: Cervical adenopathy noted on the right.   Cardiovascular: Normal rate and  rhythm. S1,S2 noted.  No murmur, rubs or gallops noted.  Pulmonary/Chest: Normal effort and positive vesicular breath sounds. No respiratory distress. No wheezes, rales or ronchi noted.       Assessment & Plan:   Viral Upper Respiratory Infection with Cough:  Rapid COVID: Negative Rapid flu: Negative Get some rest and drink plenty of water Do salt water gargles for the sore throat Rx for 2% Viscous lidocaine Rx for Pred taper x 6 days for symptom management Rx for Azithromax x 5 days to start on Monday if symptoms worsen   RTC in 3 months for annual exam   Webb Silversmith, NP

## 2022-11-03 NOTE — Patient Instructions (Signed)
Viral Respiratory Infection A respiratory infection is an illness that affects part of the respiratory system, such as the lungs, nose, or throat. A respiratory infection that is caused by a virus is called a viral respiratory infection. Common types of viral respiratory infections include: A cold. The flu (influenza). A respiratory syncytial virus (RSV) infection. What are the causes? This condition is caused by a virus. The virus may spread through contact with droplets or direct contact with infected people or their mucus or secretions. The virus may spread from person to person (is contagious). What are the signs or symptoms? Symptoms of this condition include: A stuffy or runny nose. A sore throat or cough. Shortness of breath or difficulty breathing. Yellow or green mucus (sputum). Other symptoms may include: A fever. Sweating or chills. Fatigue. Achy muscles. A headache. How is this diagnosed? This condition may be diagnosed based on: Your symptoms. A physical exam. Testing of secretions from the nose or throat. Chest X-ray. How is this treated? This condition may be treated with medicines, such as: Antiviral medicine. This may shorten the length of time a person has symptoms. Expectorants. These make it easier to cough up mucus. Decongestant nasal sprays. Acetaminophen or NSAIDs, such as ibuprofen, to relieve fever and pain. Antibiotic medicines are not prescribed for viral infections.This is because antibiotics are designed to kill bacteria. They do not kill viruses. Follow these instructions at home: Managing pain and congestion Take over-the-counter and prescription medicines only as told by your health care provider. If you have a sore throat, gargle with a mixture of salt and water 3-4 times a day or as needed. To make salt water, completely dissolve -1 tsp (3-6 g) of salt in 1 cup (237 mL) of warm water. Use nose drops made from salt water to ease congestion and  soften raw skin around your nose. Take 2 tsp (10 mL) of honey at bedtime to lessen coughing at night. Do not give honey to children who are younger than 1 year. Drink enough fluid to keep your urine pale yellow. This helps prevent dehydration and helps loosen up mucus. General instructions  Rest as much as possible. Do not drink alcohol. Do not use any products that contain nicotine or tobacco. These products include cigarettes, chewing tobacco, and vaping devices, such as e-cigarettes. If you need help quitting, ask your health care provider. Keep all follow-up visits. This is important. How is this prevented?     Get an annual flu shot. You may get the flu shot in late summer, fall, or winter. Ask your health care provider when you should get your flu shot. Avoid spreading your infection to other people. If you are sick: Wash your hands with soap and water often, especially after you cough or sneeze. Wash for at least 20 seconds. If soap and water are not available, use alcohol-based hand sanitizer. Cover your mouth when you cough. Cover your nose and mouth when you sneeze. Do not share cups or eating utensils. Clean commonly used objects often. Clean commonly touched surfaces. Stay home from work or school as told by your health care provider. Avoid contact with people who are sick during cold and flu season. This is generally fall and winter. Contact a health care provider if: Your symptoms last for 10 days or longer. Your symptoms get worse over time. You have severe sinus pain in your face or forehead. The glands in your jaw or neck become very swollen. You have shortness of breath. Get   help right away if you: Feel pain or pressure in your chest. Have trouble breathing. Faint or feel like you will faint. Have severe and persistent vomiting. Feel confused or disoriented. These symptoms may represent a serious problem that is an emergency. Do not wait to see if the symptoms will  go away. Get medical help right away. Call your local emergency services (911 in the [place] Do not drive yourself to the hospital. Summary A respiratory infection is an illness that affects part of the respiratory system, such as the lungs, nose, or throat. A respiratory infection that is caused by a virus is called a viral respiratory infection. Common types of viral respiratory infections include a cold, influenza, and respiratory syncytial virus (RSV) infection. Symptoms of this condition include a stuffy or runny nose, cough, fatigue, achy muscles, sore throat, and fevers or chills. Antibiotic medicines are not prescribed for viral infections. This is because antibiotics are designed to kill bacteria. They are not effective against viruses. This information is not intended to replace advice given to you by your health care provider. Make sure you discuss any questions you have with your health care provider. Document Revised: 12/09/2020 Document Reviewed: 12/09/2020 Elsevier Patient Education  2023 [employer] Inc.  

## 2023-02-01 ENCOUNTER — Encounter: Payer: Self-pay | Admitting: Internal Medicine

## 2023-02-01 ENCOUNTER — Ambulatory Visit (INDEPENDENT_AMBULATORY_CARE_PROVIDER_SITE_OTHER): Payer: BLUE CROSS/BLUE SHIELD | Admitting: Internal Medicine

## 2023-02-01 VITALS — BP 116/78 | HR 54 | Temp 96.2°F | Ht 72.0 in | Wt 255.0 lb

## 2023-02-01 DIAGNOSIS — E6609 Other obesity due to excess calories: Secondary | ICD-10-CM

## 2023-02-01 DIAGNOSIS — Z0001 Encounter for general adult medical examination with abnormal findings: Secondary | ICD-10-CM | POA: Diagnosis not present

## 2023-02-01 DIAGNOSIS — R21 Rash and other nonspecific skin eruption: Secondary | ICD-10-CM

## 2023-02-01 DIAGNOSIS — E66811 Obesity, class 1: Secondary | ICD-10-CM

## 2023-02-01 DIAGNOSIS — R7309 Other abnormal glucose: Secondary | ICD-10-CM | POA: Diagnosis not present

## 2023-02-01 DIAGNOSIS — R739 Hyperglycemia, unspecified: Secondary | ICD-10-CM

## 2023-02-01 DIAGNOSIS — E7800 Pure hypercholesterolemia, unspecified: Secondary | ICD-10-CM

## 2023-02-01 DIAGNOSIS — Z6834 Body mass index (BMI) 34.0-34.9, adult: Secondary | ICD-10-CM

## 2023-02-01 LAB — CBC
MCHC: 33.7 g/dL (ref 32.0–36.0)
MCV: 91.3 fL (ref 80.0–100.0)
Platelets: 185 10*3/uL (ref 140–400)

## 2023-02-01 MED ORDER — CEPHALEXIN 500 MG PO CAPS: 500.0000 mg | ORAL_CAPSULE | Freq: Three times a day (TID) | ORAL | 0 refills | Status: AC

## 2023-02-01 MED ORDER — TRIAMCINOLONE ACETONIDE 0.025 % EX OINT: 1.0000 | TOPICAL_OINTMENT | Freq: Two times a day (BID) | CUTANEOUS | 0 refills | Status: AC

## 2023-02-01 NOTE — Assessment & Plan Note (Signed)
Encourage diet and exercise for weight loss 

## 2023-02-01 NOTE — Patient Instructions (Signed)
Health Maintenance, Male Adopting a healthy lifestyle and getting preventive care are important in promoting health and wellness. Ask your health care provider about: The right schedule for you to have regular tests and exams. Things you can do on your own to prevent diseases and keep yourself healthy. What should I know about diet, weight, and exercise? Eat a healthy diet  Eat a diet that includes plenty of vegetables, fruits, low-fat dairy products, and lean protein. Do not eat a lot of foods that are high in solid fats, added sugars, or sodium. Maintain a healthy weight Body mass index (BMI) is a measurement that can be used to identify possible weight problems. It estimates body fat based on height and weight. Your health care provider can help determine your BMI and help you achieve or maintain a healthy weight. Get regular exercise Get regular exercise. This is one of the most important things you can do for your health. Most adults should: Exercise for at least 150 minutes each week. The exercise should increase your heart rate and make you sweat (moderate-intensity exercise). Do strengthening exercises at least twice a week. This is in addition to the moderate-intensity exercise. Spend less time sitting. Even light physical activity can be beneficial. Watch cholesterol and blood lipids Have your blood tested for lipids and cholesterol at 44 years of age, then have this test every 5 years. You may need to have your cholesterol levels checked more often if: Your lipid or cholesterol levels are high. You are older than 44 years of age. You are at high risk for heart disease. What should I know about cancer screening? Many types of cancers can be detected early and may often be prevented. Depending on your health history and family history, you may need to have cancer screening at various ages. This may include screening for: Colorectal cancer. Prostate cancer. Skin cancer. Lung  cancer. What should I know about heart disease, diabetes, and high blood pressure? Blood pressure and heart disease High blood pressure causes heart disease and increases the risk of stroke. This is more likely to develop in people who have high blood pressure readings or are overweight. Talk with your health care provider about your target blood pressure readings. Have your blood pressure checked: Every 3-5 years if you are 18-39 years of age. Every year if you are 40 years old or older. If you are between the ages of 65 and 75 and are a current or former smoker, ask your health care provider if you should have a one-time screening for abdominal aortic aneurysm (AAA). Diabetes Have regular diabetes screenings. This checks your fasting blood sugar level. Have the screening done: Once every three years after age 45 if you are at a normal weight and have a low risk for diabetes. More often and at a younger age if you are overweight or have a high risk for diabetes. What should I know about preventing infection? Hepatitis B If you have a higher risk for hepatitis B, you should be screened for this virus. Talk with your health care provider to find out if you are at risk for hepatitis B infection. Hepatitis C Blood testing is recommended for: Everyone born from 1945 through 1965. Anyone with known risk factors for hepatitis C. Sexually transmitted infections (STIs) You should be screened each year for STIs, including gonorrhea and chlamydia, if: You are sexually active and are younger than 44 years of age. You are older than 44 years of age and your   health care provider tells you that you are at risk for this type of infection. Your sexual activity has changed since you were last screened, and you are at increased risk for chlamydia or gonorrhea. Ask your health care provider if you are at risk. Ask your health care provider about whether you are at high risk for HIV. Your health care provider  may recommend a prescription medicine to help prevent HIV infection. If you choose to take medicine to prevent HIV, you should first get tested for HIV. You should then be tested every 3 months for as long as you are taking the medicine. Follow these instructions at home: Alcohol use Do not drink alcohol if your health care provider tells you not to drink. If you drink alcohol: Limit how much you have to 0-2 drinks a day. Know how much alcohol is in your drink. In the [place] one drink equals one 12 oz bottle of beer (355 mL), one 5 oz glass of wine (148 mL), or one 1 oz glass of hard liquor (44 mL). Lifestyle Do not use any products that contain nicotine or tobacco. These products include cigarettes, chewing tobacco, and vaping devices, such as e-cigarettes. If you need help quitting, ask your health care provider. Do not use street drugs. Do not share needles. Ask your health care provider for help if you need support or information about quitting drugs. General instructions Schedule regular health, dental, and eye exams. Stay current with your vaccines. Tell your health care provider if: You often feel depressed. You have ever been abused or do not feel safe at home. Summary Adopting a healthy lifestyle and getting preventive care are important in promoting health and wellness. Follow your health care provider's instructions about healthy diet, exercising, and getting tested or screened for diseases. Follow your health care provider's instructions on monitoring your cholesterol and blood pressure. This information is not intended to replace advice given to you by your health care provider. Make sure you discuss any questions you have with your health care provider. Document Revised: 01/24/2021 Document Reviewed: 01/24/2021 Elsevier Patient Education  2023 Elsevier Inc.  

## 2023-02-01 NOTE — Progress Notes (Signed)
Subjective:    Patient ID: [name], male    DOB: [birth year], 42 y.o.   MRN: [id number]  HPI  Patient presents to clinic today for his annual exam.  Flu: never Tetanus: > 10 years ago COVID: never Vision screening: annually Dentist: biannually  Diet: He does eat meat. He consumes some fruits and veggies. He tries to avoid fried foods. He tries to drink mostly water, sparkling water. Exercise: Lifting weights  Review of Systems  Past Medical History:  Diagnosis Date   Blood clot of neck vein    Obesity    Right tennis elbow    Sleep apnea    URI, acute     Current Outpatient Medications  Medication Sig Dispense Refill   aspirin 81 MG chewable tablet Chew by mouth daily.     azithromycin (ZITHROMAX) 250 MG tablet Take 2 tabs today, then 1 tab daily x 4 days 6 tablet 0   Cholecalciferol (VITAMIN D3) 10 MCG (400 UNIT) CAPS Take by mouth.     ketoconazole (NIZORAL) 2 % shampoo Apply topically. As needed     lidocaine (XYLOCAINE) 2 % solution Use as directed 15 mLs in the mouth or throat every 4 (four) hours as needed for mouth pain. 240 mL 0   Omega-3 Fatty Acids (FISH OIL) 1000 MG CAPS Take by mouth.     predniSONE (DELTASONE) 10 MG tablet Take 6 tabs on day 1, 5 tabs on day 2, 4 tabs on day 3, 3 tabs on day 4, 2 tabs on day 5, 1 tab on day 6 21 tablet 0   No current facility-administered medications for this visit.    No Known Allergies  Family History  Problem Relation Age of Onset   Hyperlipidemia Father    Hypertension Father    Transient ischemic attack Father    Stroke Father    Cancer - Other Mother     Social History   Socioeconomic History   Marital status: Married    Spouse name: Not on file   Number of children: Not on file   Years of education: Not on file   Highest education level: Not on file  Occupational History   Not on file  Tobacco Use   Smoking status: Never   Smokeless tobacco: Never  Vaping Use   Vaping Use: Never used   Substance and Sexual Activity   Alcohol use: Not Currently    Alcohol/week: 3.0 standard drinks of alcohol    Types: 3 Cans of beer per week   Drug use: No   Sexual activity: Not on file  Other Topics Concern   Not on file  Social History Narrative   Not on file   Social Determinants of Health   Financial Resource Strain: Not on file  Food Insecurity: Not on file  Transportation Needs: Not on file  Physical Activity: Not on file  Stress: Not on file  Social Connections: Not on file  Intimate Partner Violence: Not on file     Constitutional: Denies fever, malaise, fatigue, headache or abrupt weight changes.  HEENT: Denies eye pain, eye redness, ear pain, ringing in the ears, wax buildup, runny nose, nasal congestion, bloody nose, or sore throat. Respiratory: Denies difficulty breathing, shortness of breath, cough or sputum production.   Cardiovascular: Denies chest pain, chest tightness, palpitations or swelling in the hands or feet.  Gastrointestinal: Denies abdominal pain, bloating, constipation, diarrhea or blood in the stool.  GU: Denies urgency, frequency, pain with urination,  burning sensation, blood in urine, odor or discharge. Musculoskeletal: Denies decrease in range of motion, difficulty with gait, muscle pain or joint pain and swelling.  Skin: Patient reports rash to right anterior leg.  Denies ulcercations.  Neurological: Denies dizziness, difficulty with memory, difficulty with speech or problems with balance and coordination.  Psych: Denies anxiety, depression, SI/HI.  No other specific complaints in a complete review of systems (except as listed in HPI above).     Objective:   Physical Exam   BP 116/78 (BP Location: Right Arm, Patient Position: Sitting, Cuff Size: Large)   Pulse (!) 54   Temp (!) 96.2 F (35.7 C) (Temporal)   Ht 6' (1.829 m)   Wt 255 lb (115.7 kg)   SpO2 100%   BMI 34.58 kg/m   Wt Readings from Last 3 Encounters:  11/03/22 266 lb  (120.7 kg)  01/26/22 246 lb (111.6 kg)  10/28/21 244 lb (110.7 kg)    General: Appears his stated age, obese, in NAD. Skin: Warm, dry and intact.  Grouped, papular/pustular rash noted to right anterior shin. HEENT: Head: normal shape and size; Eyes: sclera white, no icterus, conjunctiva pink, PERRLA and EOMs intact;  Neck:  Neck supple, trachea midline. No masses, lumps or thyromegaly present.  Cardiovascular: Bradycardic with normal rhythm. S1,S2 noted.  No murmur, rubs or gallops noted. No JVD or BLE edema.  Pulmonary/Chest: Normal effort and positive vesicular breath sounds. No respiratory distress. No wheezes, rales or ronchi noted.  Abdomen:  Normal bowel sounds.  Musculoskeletal: Strength 5/5 BUE/BLE.  No difficulty with gait.  Neurological: Alert and oriented. Cranial nerves II-XII grossly intact. Coordination normal.  Psychiatric: Mood and affect normal. Behavior is normal. Judgment and thought content normal.    BMET    Component Value Date/Time   NA 140 01/26/2022 0829   NA 138 01/18/2016 0944   K 4.4 01/26/2022 0829   CL 105 01/26/2022 0829   CO2 28 01/26/2022 0829   GLUCOSE 98 01/26/2022 0829   BUN 20 01/26/2022 0829   BUN 14 01/18/2016 0944   CREATININE 1.29 01/26/2022 0829   CALCIUM 9.5 01/26/2022 0829   GFRNONAA 75 05/12/2020 1055   GFRAA 87 05/12/2020 1055    Lipid Panel     Component Value Date/Time   CHOL 226 (H) 01/26/2022 0829   CHOL 226 (H) 01/18/2016 0944   TRIG 75 01/26/2022 0829   HDL 51 01/26/2022 0829   HDL 40 01/18/2016 0944   CHOLHDL 4.4 01/26/2022 0829   LDLCALC 157 (H) 01/26/2022 0829    CBC    Component Value Date/Time   WBC 7.5 01/26/2022 0829   RBC 5.15 01/26/2022 0829   HGB 16.0 01/26/2022 0829   HGB 15.3 01/18/2016 0944   HCT 47.1 01/26/2022 0829   HCT 43.4 01/18/2016 0944   PLT 170 01/26/2022 0829   PLT 186 01/18/2016 0944   MCV 91.5 01/26/2022 0829   MCV 86 01/18/2016 0944   MCH 31.1 01/26/2022 0829   MCHC 34.0  01/26/2022 0829   RDW 12.9 01/26/2022 0829   RDW 14.3 01/18/2016 0944   LYMPHSABS 2,288 05/12/2020 1055   LYMPHSABS 3.2 (H) 01/18/2016 0944   EOSABS 128 05/12/2020 1055   EOSABS 0.3 01/18/2016 0944   BASOSABS 32 05/12/2020 1055   BASOSABS 0.0 01/18/2016 0944    Hgb A1C Lab Results  Component Value Date   HGBA1C 5.1 01/26/2022           Assessment & Plan:   Preventative  Health Maintenance:  Encouraged him to get a flu shot in the fall Tetanus declined Encouraged him to get his COVID-vaccine Encouraged him to consume a balanced diet and exercise regimen Advised him to see an eye doctor and dentist annually We will check CBC, c-Met, lipid, A1c today  Rash of Leg:  Rx for Triamcinolone twice daily as needed Rx for Keflex 500 mg 3 times daily x 7 days  RTC in 6 months, follow-up chronic conditions Nicki Reaper, NP

## 2023-02-02 LAB — CBC
HCT: 48.4 % (ref 38.5–50.0)
Hemoglobin: 16.3 g/dL (ref 13.2–17.1)
MCH: 30.8 pg (ref 27.0–33.0)
MPV: 10.9 fL (ref 7.5–12.5)
RBC: 5.3 10*6/uL (ref 4.20–5.80)
RDW: 13.4 % (ref 11.0–15.0)
WBC: 7 10*3/uL (ref 3.8–10.8)

## 2023-02-02 LAB — HEMOGLOBIN A1C
Hgb A1c MFr Bld: 5.3 % of total Hgb (ref <5.7)
Mean Plasma Glucose: 105 mg/dL
eAG (mmol/L): 5.8 mmol/L

## 2023-02-02 LAB — COMPLETE METABOLIC PANEL WITH GFR
AG Ratio: 1.8 (calc) (ref 1.0–2.5)
ALT: 14 U/L (ref 9–46)
AST: 20 U/L (ref 10–40)
Albumin: 4.9 g/dL (ref 3.6–5.1)
Alkaline phosphatase (APISO): 47 U/L (ref 36–130)
BUN/Creatinine Ratio: 14 (calc) (ref 6–22)
BUN: 20 mg/dL (ref 7–25)
CO2: 26 mmol/L (ref 20–32)
Calcium: 9.8 mg/dL (ref 8.6–10.3)
Chloride: 104 mmol/L (ref 98–110)
Creat: 1.4 mg/dL — ABNORMAL HIGH (ref 0.60–1.29)
Globulin: 2.7 g/dL (calc) (ref 1.9–3.7)
Glucose, Bld: 99 mg/dL (ref 65–99)
Potassium: 4.3 mmol/L (ref 3.5–5.3)
Sodium: 140 mmol/L (ref 135–146)
Total Bilirubin: 0.8 mg/dL (ref 0.2–1.2)
Total Protein: 7.6 g/dL (ref 6.1–8.1)
eGFR: 64 mL/min/{1.73_m2} (ref >=60)

## 2023-02-02 LAB — LIPID PANEL
Cholesterol: 251 mg/dL — ABNORMAL HIGH (ref <200)
HDL: 47 mg/dL (ref >=40)
LDL Cholesterol (Calc): 182 mg/dL (calc) — ABNORMAL HIGH
Non-HDL Cholesterol (Calc): 204 mg/dL (calc) — ABNORMAL HIGH (ref <130)
Total CHOL/HDL Ratio: 5.3 (calc) — ABNORMAL HIGH (ref <5.0)
Triglycerides: 101 mg/dL (ref <150)

## 2023-02-27 ENCOUNTER — Other Ambulatory Visit: Payer: Self-pay | Admitting: Internal Medicine

## 2023-02-28 NOTE — Telephone Encounter (Signed)
Unable to refill per protocol, Rx expired. Lidocaine was discontinued 02/01/23. Keflex was given as short supply.  Requested Prescriptions  Pending Prescriptions Disp Refills   lidocaine (XYLOCAINE) 2 % solution [Pharmacy Med Name: LIDOCAINE 2% VISCOUS SOLN] 240 mL 0    Sig: USE AS DIRECTED 15 MLS IN THE MOUTH OR THROAT EVERY 4 (FOUR) HOURS AS NEEDED FOR MOUTH PAIN.     Off-Protocol Failed - 02/27/2023 10:53 AM      Failed - Medication not assigned to a protocol, review manually.      Passed - Valid encounter within last 12 months    Recent Outpatient Visits           3 weeks ago Encounter for general adult medical examination with abnormal findings   [place] [facility] [place], Salvadore Oxford, NP   3 months ago Viral URI with cough   [place] [facility] [place], Salvadore Oxford, NP   1 year ago Encounter for general adult medical examination with abnormal findings   [place] [facility] [place], Salvadore Oxford, NP   1 year ago Non-recurrent acute suppurative otitis media of left ear without spontaneous rupture of tympanic membrane   [place] [facility] [place], Netta Neat, DO   2 years ago Finger pain, left   [place] [facility], Jodelle Gross, FNP       Future Appointments             In 5 months Baity, Salvadore Oxford, NP [place] [facility] [place]             cephALEXin (KEFLEX) 500 MG capsule [Pharmacy Med Name: CEPHALEXIN 500 MG CAPSULE] 21 capsule 0    Sig: TAKE 1 CAPSULE BY MOUTH THREE TIMES A DAY     Off-Protocol Failed - 02/27/2023 10:53 AM      Failed - Medication not assigned to a protocol, review manually.      Passed - Valid encounter within last 12 months    Recent Outpatient Visits           3 weeks ago Encounter for general adult medical examination with abnormal findings   [place] [facility], Salvadore Oxford, NP   3 months ago Viral URI with  cough   [place] [facility], Salvadore Oxford, NP   1 year ago Encounter for general adult medical examination with abnormal findings   [place] [facility] [place], Salvadore Oxford, NP   1 year ago Non-recurrent acute suppurative otitis media of left ear without spontaneous rupture of tympanic membrane   [place] [facility] [place], Netta Neat, DO   2 years ago Finger pain, left   [place] [facility], Jodelle Gross, FNP       Future Appointments             In 5 months Baity, Salvadore Oxford, NP  [facility]

## 2023-03-07 ENCOUNTER — Encounter: Payer: Self-pay | Admitting: Physician Assistant

## 2023-03-07 ENCOUNTER — Ambulatory Visit: Payer: Self-pay | Admitting: *Deleted

## 2023-03-07 ENCOUNTER — Ambulatory Visit (INDEPENDENT_AMBULATORY_CARE_PROVIDER_SITE_OTHER): Payer: BLUE CROSS/BLUE SHIELD | Admitting: Physician Assistant

## 2023-03-07 VITALS — BP 124/82 | HR 76 | Temp 97.5°F | Resp 16 | Ht 72.0 in | Wt 253.2 lb

## 2023-03-07 DIAGNOSIS — L03115 Cellulitis of right lower limb: Secondary | ICD-10-CM | POA: Diagnosis not present

## 2023-03-07 MED ORDER — SULFAMETHOXAZOLE-TRIMETHOPRIM 800-160 MG PO TABS: 1.0000 | ORAL_TABLET | Freq: Two times a day (BID) | ORAL | 0 refills | Status: AC

## 2023-03-07 NOTE — Telephone Encounter (Signed)
Summary: swollen right sheen   Pt states that his skin is red and swollen and can tell when he is walking it not as comfortable as it should be (right sheen, pt states same place as before.)  Pt is requesting a refill on cephALEXin (KEFLEX) 500 MG capsule. Per pt this helped him before. Please advise.         Reason for Disposition . [1] Looks infected (spreading redness, pus) AND [2] no fever  Answer Assessment - Initial Assessment Questions 1. APPEARANCE of RASH: "Describe the rash."      Red, itching- swollen- mild 2. LOCATION: "Where is the rash located?"      R shin 3. NUMBER: "How many spots are there?"      "Blotchy" area- large red area 4. SIZE: "How big are the spots?" (Inches, centimeters or compare to size of a coin)      Baseball sized area 5. ONSET: "When did the rash start?"      1 month- antibiotic helped clear it- redness gone- came back 6. ITCHING: "Does the rash itch?" If Yes, ask: "How bad is the itch?"  (Scale 0-10; or none, mild, moderate, severe)     Itches- mild 7. PAIN: "Does the rash hurt?" If Yes, ask: "How bad is the pain?"  (Scale 0-10; or none, mild, moderate, severe)    - NONE (0): no pain    - MILD (1-3): doesn't interfere with normal activities     - MODERATE (4-7): interferes with normal activities or awakens from sleep     - SEVERE (8-10): excruciating pain, unable to do any normal activities     No pain- is aware it is there 8. OTHER SYMPTOMS: "Do you have any other symptoms?" (e.g., fever)     no  Protocols used: Rash or Redness - Localized-A-AH

## 2023-03-07 NOTE — Telephone Encounter (Signed)
  Chief Complaint: red area on R shin- itching Symptoms: rash type irritation of skin- reoccurrence from treatment 1 month ago with antibiotic Frequency: started 1 month ago- cleared and now returned-2-3 days Pertinent Negatives: Patient denies pain, fever Disposition: [] ED /[] Urgent Care (no appt availability in office) / [x] Appointment(In office/virtual)/ []  [place] [facility]/ [] Home Care/ [] Refused Recommended Disposition /[] [place] [facility]/ []  Follow-up with PCP Additional Notes: No available appointment t for reassessment in PCP office- patient has been scheduled with float provider.

## 2023-03-07 NOTE — Progress Notes (Signed)
Acute Office Visit   Patient: Adam Schultz   DOB: 12-18-1980   42 y.o. Male  MRN: [id number] Visit Date: 03/07/2023  Today's healthcare provider: Oswaldo Conroy Youlanda Tomassetti, PA-C  Introduced myself to the patient as a Secondary school teacher and provided education on APPs in clinical practice.    Chief Complaint  Patient presents with   Rash    Lower right shin, Previously treated but flared up again, red/itches   Subjective    HPI HPI     Rash    Additional comments: Lower right shin, Previously treated but flared up again, red/itches      Last edited by Forde Radon, CMA on 03/07/2023 10:57 AM.      Adam Schultz Duration:   about a week    He denies injury to the area prior to first encounter on 02/01/23  Location: legs - right shin  Itching: yes- especially first thing in the AM  Burning:  sometimes if it comes into contact with direct sunlight  Redness: yes Oozing: no Scaling: no Blisters: no Painful: no Fevers: no Change in detergents/soaps/personal care products: no Recent illness: no Recent travel:yes- went to the beach after it first appeared about a month ago and then to [facility] after it started to recur History of same: yes about a month ago in same area  Context:  recurrent  Alleviating factors:  Keflex  Treatments attempted: Was previously treated with Keflex and triamcinolone cream  by PCP about a month ago and it has returned   he reports it initially had almost completely resolved with Keflex but started to come back about 1 week ago  Shortness of breath: no  Throat/tongue swelling: no Myalgias/arthralgias: no   Medications: Outpatient Medications Prior to Visit  Medication Sig   aspirin 81 MG chewable tablet Chew by mouth daily.   Cholecalciferol (VITAMIN D3) 10 MCG (400 UNIT) CAPS Take by mouth.   Omega-3 Fatty Acids (FISH OIL) 1000 MG CAPS Take by mouth.   cephALEXin (KEFLEX) 500 MG capsule Take 1 capsule (500 mg total) by mouth 3 (three) times daily. (Patient  not taking: Reported on 03/07/2023)   ketoconazole (NIZORAL) 2 % shampoo Apply topically. As needed (Patient not taking: Reported on 03/07/2023)   triamcinolone (KENALOG) 0.025 % ointment Apply 1 Application topically 2 (two) times daily. (Patient not taking: Reported on 03/07/2023)   No facility-administered medications prior to visit.    Review of Systems  Skin:  Positive for rash.       Objective    BP 124/82   Pulse 76   Temp (!) 97.5 F (36.4 C) (Oral)   Resp 16   Ht 6' (1.829 m)   Wt 253 lb 3.2 oz (114.9 kg)   SpO2 98%   BMI 34.34 kg/m    Physical Exam Vitals reviewed.  Constitutional:      General: He is awake.     Appearance: Normal appearance. He is well-developed and well-groomed.  HENT:     Head: Normocephalic and atraumatic.  Pulmonary:     Effort: Pulmonary effort is normal.  Skin:    General: Skin is warm.     Findings: Lesion and rash present. Rash is macular.     Comments: Increased warmth and tenderness to palpation. Notable erythema and some mild skin breakdown in central areas No notable induration or fluctuance on palpation   Neurological:     Mental Status: He is alert.  Psychiatric:  Behavior: Behavior is cooperative.       Media Information   Document Information  Photos  Rash on right shin, Alcohol pad for scale  03/07/2023 11:09  Attached To:  Office Visit on 03/07/23 with Erick Murin, Oswaldo Conroy, PA-C  Source Information  Anea Fodera, Oswaldo Conroy, PA-C  [facility] Information   Document Information  Photos  Rash on right shin, Alcohol pad for scale  03/07/2023 11:09  Attached To:  Office Visit on 03/07/23 with Jadriel Saxer, Oswaldo Conroy, PA-C  Source Information  Alayha Babineaux, Oswaldo Conroy, PA-C  [facility]    No results found for any visits on 03/07/23.  Assessment & Plan      No follow-ups on file.      Problem List Items Addressed This Visit   None Visit Diagnoses     Cellulitis of right lower extremity    -   Primary Acute, recurrent  Reports recurrence of itchy, erythematous rash on right shin for the past week He was initially treated for this on 02/01/23 by PCP with Keflex and triamcinolone- reports rash seemed to drastically improve with the abx but came back about a week ago Suspect cellulitis at this time Will provide script for Bactrim PO BID x 10 days Recommend he keeps area clean and dry with gentle soap and warm water- no alcohol, moisturizers, or harsh cleansers  Reviewed ED and return precautions- if not improving or if it recurs again, recommend referral to dermatology for evaluation.  Follow up as needed for persistent or progressing symptoms     Relevant Medications   sulfamethoxazole-trimethoprim (BACTRIM DS) 800-160 MG tablet        No follow-ups on file.   I, Devani Odonnel E Scotty Weigelt, PA-C, have reviewed all documentation for this visit. The documentation on 03/07/23 for the exam, diagnosis, procedures, and orders are all accurate and complete.   Jacquelin Hawking, MHS, PA-C [facility]

## 2023-04-03 ENCOUNTER — Ambulatory Visit: Payer: Self-pay

## 2023-04-03 NOTE — Telephone Encounter (Signed)
Message from Oblong P sent at 04/03/2023  9:05 AM EDT  Summary: place on shin   [place] on his rt skin that has been treated a couple times is worse.   He says it is still red after a couple of antibiotics.  It has actually gotten worse. Please advise  CB#  [phone]         Chief Complaint: lower right shin redness and swelling Symptoms: area the width of a softball that is spreading to the calf area, no blisters but dark red line perimeter , middle of rash pink and white splotches, occasional itching Frequency: 2 weeks ago  Pertinent Negatives: Patient denies fever Disposition: [] ED /[x] Urgent Care (no appt availability in office) / [] Appointment(In office/virtual)/ []  [facility]/ [] Home Care/ [] Refused Recommended Disposition /[] [facility]/ []  Follow-up with PCP Additional Notes: pt has a call into the dermatologist office. Pt stated has taken 2 rounds of abx and topical steroid cream without improvement. [place]. Advised to go to ED if fever ort worsens.   Reason for Disposition  [1] Looks infected (spreading redness, pus) AND [2] large red area (> 2 in. or 5 cm)  Answer Assessment - Initial Assessment Questions 1. APPEARANCE of RASH: "Describe the rash."      Soft ball sized in width  - dark red line on outside middle pink and white splotches 2. LOCATION: "Where is the rash located?"      Right lower shin  3. NUMBER: "How many spots are there?"      Slightly  4. SIZE: "How big are the spots?" (Inches, centimeters or compare to size of a coin)      Small white splotches 5. ONSET: "When did the rash start?"      2 weeks ago  6. ITCHING: "Does the rash itch?" If Yes, ask: "How bad is the itch?"  (Scale 0-10; or none, mild, moderate, severe)     Occasionally  7. PAIN: "Does the rash hurt?" If Yes, ask: "How bad is the pain?"  (Scale 0-10; or none, mild, moderate,  severe)    - NONE (0): no pain    - MILD (1-3): doesn't interfere with normal activities     - MODERATE (4-7): interferes with normal activities or awakens from sleep     - SEVERE (8-10): excruciating pain, unable to do any normal activities     no 8. OTHER SYMPTOMS: "Do you have any other symptoms?" (e.g., fever)     Swelling. Larger - tight in the am - feels like stinging that comes and goes  Protocols used: Rash or Redness - Localized-A-AH

## 2023-04-03 NOTE — Telephone Encounter (Signed)
Agree with advice given

## 2023-08-07 ENCOUNTER — Ambulatory Visit: Payer: BLUE CROSS/BLUE SHIELD | Admitting: Internal Medicine
